# Patient Record
Sex: Male | Born: 1950
Health system: Southern US, Community
[De-identification: ages and names within clinical notes are randomized; demographics above are authoritative.]

## PROBLEM LIST (undated history)

## (undated) DIAGNOSIS — T7840XA Allergy, unspecified, initial encounter: Secondary | ICD-10-CM

## (undated) DIAGNOSIS — E785 Hyperlipidemia, unspecified: Secondary | ICD-10-CM

## (undated) DIAGNOSIS — H269 Unspecified cataract: Secondary | ICD-10-CM

## (undated) DIAGNOSIS — G4730 Sleep apnea, unspecified: Secondary | ICD-10-CM

## (undated) DIAGNOSIS — J45909 Unspecified asthma, uncomplicated: Secondary | ICD-10-CM

## (undated) DIAGNOSIS — K219 Gastro-esophageal reflux disease without esophagitis: Secondary | ICD-10-CM

## (undated) DIAGNOSIS — L0390 Cellulitis, unspecified: Secondary | ICD-10-CM

## (undated) DIAGNOSIS — A069 Amebiasis, unspecified: Secondary | ICD-10-CM

## (undated) HISTORY — DX: Sleep apnea, unspecified: G47.30

## (undated) HISTORY — DX: Amebiasis, unspecified: A06.9

## (undated) HISTORY — PX: TONSILLECTOMY: SUR1361

## (undated) HISTORY — DX: Hyperlipidemia, unspecified: E78.5

## (undated) HISTORY — DX: Allergy, unspecified, initial encounter: T78.40XA

## (undated) HISTORY — DX: Unspecified asthma, uncomplicated: J45.909

## (undated) HISTORY — DX: Unspecified cataract: H26.9

## (undated) HISTORY — DX: Gastro-esophageal reflux disease without esophagitis: K21.9

## (undated) HISTORY — PX: OTHER SURGICAL HISTORY: SHX169

## (undated) HISTORY — DX: Cellulitis, unspecified: L03.90

---

## 2007-08-22 LAB — PSA

## 2014-03-21 ENCOUNTER — Ambulatory Visit: Payer: Self-pay

## 2014-03-23 ENCOUNTER — Ambulatory Visit: Payer: Self-pay

## 2014-03-26 ENCOUNTER — Ambulatory Visit: Payer: Self-pay

## 2015-01-29 ENCOUNTER — Ambulatory Visit: Payer: Self-pay | Admitting: Unknown Physician Specialty

## 2015-03-10 ENCOUNTER — Ambulatory Visit (INDEPENDENT_AMBULATORY_CARE_PROVIDER_SITE_OTHER): Payer: No Typology Code available for payment source | Admitting: Unknown Physician Specialty

## 2015-03-10 ENCOUNTER — Encounter: Payer: Self-pay | Admitting: Unknown Physician Specialty

## 2015-03-10 ENCOUNTER — Other Ambulatory Visit: Payer: Self-pay | Admitting: Unknown Physician Specialty

## 2015-03-10 VITALS — BP 131/80 | HR 72 | Temp 97.4°F | Ht 67.5 in | Wt 362.0 lb

## 2015-03-10 DIAGNOSIS — Z0000 Encounter for general adult medical examination without abnormal findings: Secondary | ICD-10-CM

## 2015-03-10 DIAGNOSIS — G4730 Sleep apnea, unspecified: Secondary | ICD-10-CM | POA: Diagnosis not present

## 2015-03-10 DIAGNOSIS — E668 Other obesity: Secondary | ICD-10-CM | POA: Insufficient documentation

## 2015-03-10 DIAGNOSIS — Z23 Encounter for immunization: Secondary | ICD-10-CM

## 2015-03-10 DIAGNOSIS — K219 Gastro-esophageal reflux disease without esophagitis: Secondary | ICD-10-CM

## 2015-03-10 LAB — BAYER DCA HB A1C WAIVED: HB A1C (BAYER DCA - WAIVED): 5.8 % (ref <7.0)

## 2015-03-10 NOTE — Progress Notes (Signed)
BP 131/80 mmHg  Pulse 72  Temp(Src) 97.4 F (36.3 C)  Ht 5' 7.5" (1.715 m)  Wt 362 lb (164.202 kg)  BMI 55.83 kg/m2  SpO2 93%   Subjective:    Patient ID: Robert Rasmussen, male    DOB: 09-05-1950, 64 y.o.   MRN: [id number]  HPI: Robert Rasmussen is a 64 y.o. male  Chief Complaint  Patient presents with  . Establish Care    pt states he is here to re-establish care, states he wants to talk about diabetes and a colonoscopy  . Insomnia    pt states he has trouble sleeping for about a year now  . Mass    pt states he has a lump on left leg that he would like looked at, states it has been there for a few months   Diabetes "I don't exercise, eat right, work too hard, and am overweight"  Insomnia Stress related with death of parents.  Snores loudly.  No bed partner. Sleepy after meals.    Relevant past medical, surgical, family and social history reviewed and updated as indicated. Interim medical history since our last visit reviewed. Allergies and medications reviewed and updated.  Review of Systems  Constitutional: Negative.   HENT: Negative.   Eyes: Negative.   Respiratory: Negative.   Cardiovascular: Negative.   Gastrointestinal: Negative.   Endocrine: Negative.   Genitourinary: Negative.   Skin: Negative.        Lump on leg for 2 months  Allergic/Immunologic: Negative.   Neurological: Negative.   Hematological: Negative.   Psychiatric/Behavioral: Negative.     Per HPI unless specifically indicated above     Objective:    BP 131/80 mmHg  Pulse 72  Temp(Src) 97.4 F (36.3 C)  Ht 5' 7.5" (1.715 m)  Wt 362 lb (164.202 kg)  BMI 55.83 kg/m2  SpO2 93%  --------------------------------------------------------------------------------------------------------- Wt Readings from Last 3 Encounters:  03/10/15 362 lb (164.202 kg)  02/09/12 345 lb (156.491 kg)    Physical Exam  Constitutional: He is oriented to person, place, and time. He appears well-developed and  well-nourished.  HENT:  Head: Normocephalic.  Eyes: Pupils are equal, round, and reactive to light.  Cardiovascular: Normal rate, regular rhythm and normal heart sounds.   Pulmonary/Chest: Effort normal.  Abdominal: Soft. Bowel sounds are normal.  Musculoskeletal: Normal range of motion.  Neurological: He is alert and oriented to person, place, and time. He has normal reflexes.  Skin: Skin is warm and dry.  Psychiatric: He has a normal mood and affect. His behavior is normal. Judgment and thought content normal.      Assessment & Plan:   Problem List Items Addressed This Visit      Unprioritized   Extreme obesity (HCC)    Pt knows he needs to exercise more.  He does admit to food as comfort.        Relevant Orders   Bayer DCA Hb A1c Waived   Sleep apnea    Order sleep study      Relevant Orders   Ambulatory referral to Sleep Studies    Other Visit Diagnoses    Immunization due    -  Primary    Relevant Orders    Flu Vaccine QUAD 36+ mos PF IM (Fluarix & Fluzone Quad PF) (Completed)    Routine general medical examination at a health care facility        Relevant Orders    CBC with Differential/Platelet  HIV antibody    Hepatitis C antibody    Comprehensive metabolic panel    Lipid Panel w/o Chol/HDL Ratio    TSH    Vit D  25 hydroxy (rtn osteoporosis monitoring)        Follow up plan: Return in about 1 year (around 03/09/2016).

## 2015-03-10 NOTE — Assessment & Plan Note (Signed)
Pt knows he needs to exercise more.  He does admit to food as comfort.

## 2015-03-10 NOTE — Assessment & Plan Note (Signed)
Reviewed.  LDL is 156.  High but not in statin benefit group

## 2015-03-10 NOTE — Assessment & Plan Note (Signed)
Order sleep study 

## 2015-03-11 ENCOUNTER — Encounter: Payer: Self-pay | Admitting: Unknown Physician Specialty

## 2015-03-11 LAB — COMPREHENSIVE METABOLIC PANEL
A/G RATIO: 1.8 (ref 1.1–2.5)
ALT: 27 IU/L (ref 0–44)
AST: 23 IU/L (ref 0–40)
Albumin: 4.1 g/dL (ref 3.6–4.8)
Alkaline Phosphatase: 49 IU/L (ref 39–117)
BILIRUBIN TOTAL: 0.6 mg/dL (ref 0.0–1.2)
BUN/Creatinine Ratio: 13 (ref 10–22)
BUN: 12 mg/dL (ref 8–27)
CHLORIDE: 99 mmol/L (ref 97–108)
CO2: 27 mmol/L (ref 18–29)
Calcium: 9 mg/dL (ref 8.6–10.2)
Creatinine, Ser: 0.94 mg/dL (ref 0.76–1.27)
GFR calc non Af Amer: 87 mL/min/{1.73_m2} (ref >59)
GFR, EST AFRICAN AMERICAN: 100 mL/min/{1.73_m2} (ref >59)
GLOBULIN, TOTAL: 2.3 g/dL (ref 1.5–4.5)
Glucose: 96 mg/dL (ref 65–99)
POTASSIUM: 4.6 mmol/L (ref 3.5–5.2)
SODIUM: 139 mmol/L (ref 134–144)
TOTAL PROTEIN: 6.4 g/dL (ref 6.0–8.5)

## 2015-03-11 LAB — CBC WITH DIFFERENTIAL/PLATELET
BASOS ABS: 0.1 10*3/uL (ref 0.0–0.2)
Basos: 1 %
EOS (ABSOLUTE): 0.2 10*3/uL (ref 0.0–0.4)
Eos: 3 %
Hematocrit: 43 % (ref 37.5–51.0)
Hemoglobin: 14.4 g/dL (ref 12.6–17.7)
Immature Grans (Abs): 0 10*3/uL (ref 0.0–0.1)
Immature Granulocytes: 1 %
LYMPHS ABS: 2.4 10*3/uL (ref 0.7–3.1)
Lymphs: 28 %
MCH: 31.7 pg (ref 26.6–33.0)
MCHC: 33.5 g/dL (ref 31.5–35.7)
MCV: 95 fL (ref 79–97)
MONOS ABS: 0.6 10*3/uL (ref 0.1–0.9)
Monocytes: 7 %
Neutrophils Absolute: 5.4 10*3/uL (ref 1.4–7.0)
Neutrophils: 60 %
Platelets: 223 10*3/uL (ref 150–379)
RBC: 4.54 x10E6/uL (ref 4.14–5.80)
RDW: 12.9 % (ref 12.3–15.4)
WBC: 8.7 10*3/uL (ref 3.4–10.8)

## 2015-03-11 LAB — VITAMIN D 25 HYDROXY (VIT D DEFICIENCY, FRACTURES): VIT D 25 HYDROXY: 19.7 ng/mL — AB (ref 30.0–100.0)

## 2015-03-11 LAB — LIPID PANEL W/O CHOL/HDL RATIO
Cholesterol, Total: 189 mg/dL (ref 100–199)
HDL: 47 mg/dL (ref >39)
LDL Calculated: 128 mg/dL — ABNORMAL HIGH (ref 0–99)
Triglycerides: 70 mg/dL (ref 0–149)
VLDL Cholesterol Cal: 14 mg/dL (ref 5–40)

## 2015-03-11 LAB — HIV ANTIBODY (ROUTINE TESTING W REFLEX): HIV SCREEN 4TH GENERATION: NONREACTIVE

## 2015-03-11 LAB — HEPATITIS C ANTIBODY

## 2015-03-11 LAB — TSH: TSH: 1.99 u[IU]/mL (ref 0.450–4.500)

## 2015-03-19 ENCOUNTER — Telehealth: Payer: Self-pay | Admitting: Gastroenterology

## 2015-03-19 ENCOUNTER — Other Ambulatory Visit: Payer: Self-pay

## 2015-03-19 NOTE — Telephone Encounter (Signed)
Gastroenterology Pre-Procedure Review  Request Date: 04-15-2015 Requesting Physician: Dr.   PATIENT REVIEW QUESTIONS: The patient responded to the following health history questions as indicated:    1. Are you having any GI issues? no 2. Do you have a personal history of Polyps? no 3. Do you have a family history of Colon Cancer or Polyps? no 4. Diabetes Mellitus? no 5. Joint replacements in the past 12 months?no 6. Major health problems in the past 3 months?no 7. Any artificial heart valves, MVP, or defibrillator?no    MEDICATIONS & ALLERGIES:    Patient reports the following regarding taking any anticoagulation/antiplatelet therapy:   Plavix, Coumadin, Eliquis, Xarelto, Lovenox, Pradaxa, Brilinta, or Effient? no Aspirin? no  Patient confirms/reports the following medications:  Current Outpatient Prescriptions  Medication Sig Dispense Refill   Multiple Vitamins-Minerals (ONE-A-DAY MENS 50+ ADVANTAGE) TABS Take 1 tablet by mouth daily.     No current facility-administered medications for this visit.    Patient confirms/reports the following allergies:  No Known Allergies  No orders of the defined types were placed in this encounter.    AUTHORIZATION INFORMATION Primary Insurance: 1D#: Group #:  Secondary Insurance: 1D#: Group #:  SCHEDULE INFORMATION: Date: 04-15-2015 Time: Location:[facility]

## 2015-03-26 ENCOUNTER — Encounter: Payer: Self-pay | Admitting: Unknown Physician Specialty

## 2015-06-16 ENCOUNTER — Telehealth: Payer: Self-pay | Admitting: Gastroenterology

## 2015-06-16 NOTE — Telephone Encounter (Signed)
LVM for pt to return my call.

## 2015-06-16 NOTE — Telephone Encounter (Signed)
Patient left a voice message that [place] He needs to reschedule his colonoscopy. Please call to reschedule.

## 2015-06-17 ENCOUNTER — Ambulatory Visit
Admission: RE | Admit: 2015-06-17 | Payer: No Typology Code available for payment source | Source: Ambulatory Visit | Admitting: Gastroenterology

## 2015-06-17 ENCOUNTER — Encounter: Admission: RE | Payer: Self-pay | Source: Ambulatory Visit

## 2015-06-17 SURGERY — COLONOSCOPY WITH PROPOFOL
Anesthesia: General

## 2015-07-01 NOTE — Telephone Encounter (Signed)
LVM for pt to return my call if he wants to reschedule colonoscopy.

## 2016-03-10 ENCOUNTER — Encounter: Payer: No Typology Code available for payment source | Admitting: Unknown Physician Specialty

## 2016-05-28 ENCOUNTER — Encounter: Payer: No Typology Code available for payment source | Admitting: Unknown Physician Specialty

## 2016-06-23 ENCOUNTER — Encounter: Payer: No Typology Code available for payment source | Admitting: Unknown Physician Specialty

## 2016-06-24 ENCOUNTER — Encounter: Payer: No Typology Code available for payment source | Admitting: Unknown Physician Specialty

## 2016-07-14 ENCOUNTER — Encounter: Payer: Self-pay | Admitting: Unknown Physician Specialty

## 2016-07-14 ENCOUNTER — Ambulatory Visit (INDEPENDENT_AMBULATORY_CARE_PROVIDER_SITE_OTHER): Payer: No Typology Code available for payment source | Admitting: Unknown Physician Specialty

## 2016-07-14 VITALS — BP 120/80 | HR 73 | Temp 98.5°F | Ht 67.5 in | Wt 353.4 lb

## 2016-07-14 DIAGNOSIS — E668 Other obesity: Secondary | ICD-10-CM

## 2016-07-14 DIAGNOSIS — Z0000 Encounter for general adult medical examination without abnormal findings: Secondary | ICD-10-CM

## 2016-07-14 DIAGNOSIS — G4730 Sleep apnea, unspecified: Secondary | ICD-10-CM

## 2016-07-14 DIAGNOSIS — Z23 Encounter for immunization: Secondary | ICD-10-CM | POA: Diagnosis not present

## 2016-07-14 NOTE — Patient Instructions (Addendum)
Influenza (Flu) Vaccine (Inactivated or Recombinant): What You Need to Know 1. Why get vaccinated? Influenza ("flu") is a contagious disease that spreads around the [place] every year, usually between October and May. Flu is caused by influenza viruses, and is spread mainly by coughing, sneezing, and close contact. Anyone can get flu. Flu strikes suddenly and can last several days. Symptoms vary by age, but can include:  fever/chills  sore throat  muscle aches  fatigue  cough  headache  runny or stuffy nose  Flu can also lead to pneumonia and blood infections, and cause diarrhea and seizures in children. If you have a medical condition, such as heart or lung disease, flu can make it worse. Flu is more dangerous for some people. Infants and young children, people 64 years of age and older, pregnant women, and people with certain health conditions or a weakened immune system are at greatest risk. Each year thousands of people in the [place] die from flu, and many more are hospitalized. Flu vaccine can:  keep you from getting flu,  make flu less severe if you do get it, and  keep you from spreading flu to your family and other people. 2. Inactivated and recombinant flu vaccines A dose of flu vaccine is recommended every flu season. Children 6 months through 8 years of age may need two doses during the same flu season. Everyone else needs only one dose each flu season. Some inactivated flu vaccines contain a very small amount of a mercury-based preservative called thimerosal. Studies have not shown thimerosal in vaccines to be harmful, but flu vaccines that do not contain thimerosal are available. There is no live flu virus in flu shots. They cannot cause the flu. There are many flu viruses, and they are always changing. Each year a new flu vaccine is made to protect against three or four viruses that are likely to cause disease in the upcoming flu season. But even when the  vaccine doesn't exactly match these viruses, it may still provide some protection. Flu vaccine cannot prevent:  flu that is caused by a virus not covered by the vaccine, or  illnesses that look like flu but are not.  It takes about 2 weeks for protection to develop after vaccination, and protection lasts through the flu season. 3. Some people should not get this vaccine Tell the person who is giving you the vaccine:  If you have any severe, life-threatening allergies. If you ever had a life-threatening allergic reaction after a dose of flu vaccine, or have a severe allergy to any part of this vaccine, you may be advised not to get vaccinated. Most, but not all, types of flu vaccine contain a small amount of egg protein.  If you ever had Guillain-Barr Syndrome (also called GBS). Some people with a history of GBS should not get this vaccine. This should be discussed with your doctor.  If you are not feeling well. It is usually okay to get flu vaccine when you have a mild illness, but you might be asked to come back when you feel better.  4. Risks of a vaccine reaction With any medicine, including vaccines, there is a chance of reactions. These are usually mild and go away on their own, but serious reactions are also possible. Most people who get a flu shot do not have any problems with it. Minor problems following a flu shot include:  soreness, redness, or swelling where the shot was given  hoarseness  sore,   red or itchy eyes  cough  fever  aches  headache  itching  fatigue  If these problems occur, they usually begin soon after the shot and last 1 or 2 days. More serious problems following a flu shot can include the following:  There may be a small increased risk of Guillain-Barre Syndrome (GBS) after inactivated flu vaccine. This risk has been estimated at 1 or 2 additional cases per million people vaccinated. This is much lower than the risk of severe complications from  flu, which can be prevented by flu vaccine.  Young children who get the flu shot along with pneumococcal vaccine (PCV13) and/or DTaP vaccine at the same time might be slightly more likely to have a seizure caused by fever. Ask your doctor for more information. Tell your doctor if a child who is getting flu vaccine has ever had a seizure.  Problems that could happen after any injected vaccine:  People sometimes faint after a medical procedure, including vaccination. Sitting or lying down for about 15 minutes can help prevent fainting, and injuries caused by a fall. Tell your doctor if you feel dizzy, or have vision changes or ringing in the ears.  Some people get severe pain in the shoulder and have difficulty moving the arm where a shot was given. This happens very rarely.  Any medication can cause a severe allergic reaction. Such reactions from a vaccine are very rare, estimated at about 1 in a million doses, and would happen within a few minutes to a few hours after the vaccination. As with any medicine, there is a very remote chance of a vaccine causing a serious injury or death. The safety of vaccines is always being monitored. For more information, visit: www.cdc.gov/vaccinesafety/ 5. What if there is a serious reaction? What should I look for? Look for anything that concerns you, such as signs of a severe allergic reaction, very high fever, or unusual behavior. Signs of a severe allergic reaction can include hives, swelling of the face and throat, difficulty breathing, a fast heartbeat, dizziness, and weakness. These would start a few minutes to a few hours after the vaccination. What should I do?  If you think it is a severe allergic reaction or other emergency that can't wait, call 9-1-1 and get the person to the nearest hospital. Otherwise, call your doctor.  Reactions should be reported to the Vaccine Adverse Event Reporting System (VAERS). Your doctor should file this report, or you  can do it yourself through the VAERS web site at www.vaers.hhs.gov, or by calling 1-800-822-7967. ? VAERS does not give medical advice. 6. The National Vaccine Injury Compensation Program The National Vaccine Injury Compensation Program (VICP) is a federal program that was created to compensate people who may have been injured by certain vaccines. Persons who believe they may have been injured by a vaccine can learn about the program and about filing a claim by calling 1-800-338-2382 or visiting the VICP website at www.hrsa.gov/vaccinecompensation. There is a time limit to file a claim for compensation. 7. How can I learn more?  Ask your healthcare provider. He or she can give you the vaccine package insert or suggest other sources of information.  Call your local or state health department.  Contact the Centers for Disease Control and Prevention (CDC): ? Call 1-800-232-4636 (1-800-CDC-INFO) or ? Visit CDC's website at www.cdc.gov/flu Vaccine Information Statement, Inactivated Influenza Vaccine (01/11/2014) This information is not intended to replace advice given to you by your health care provider. Make sure   you discuss any questions you have with your health care provider. Document Released: 03/18/2006 Document Revised: 02/12/2016 Document Reviewed: 02/12/2016 Elsevier Interactive Patient Education  2017 [employer] Tdap Vaccine (Tetanus, Diphtheria and Pertussis): What You Need to Know 1. Why get vaccinated? Tetanus, diphtheria and pertussis are very serious diseases. Tdap vaccine can protect us from these diseases. And, Tdap vaccine given to pregnant women can protect newborn babies against pertussis. TETANUS (Lockjaw) is rare in the [place] today. It causes painful muscle tightening and stiffness, usually all over the body.  It can lead to tightening of muscles in the head and neck so you can't open your mouth, swallow, or sometimes even breathe. Tetanus kills about 1 out of 10  people who are infected even after receiving the best medical care.  DIPHTHERIA is also rare in the [place] today. It can cause a thick coating to form in the back of the throat.  It can lead to breathing problems, heart failure, paralysis, and death.  PERTUSSIS (Whooping Cough) causes severe coughing spells, which can cause difficulty breathing, vomiting and disturbed sleep.  It can also lead to weight loss, incontinence, and rib fractures. Up to 2 in 100 adolescents and 5 in 100 adults with pertussis are hospitalized or have complications, which could include pneumonia or death.  These diseases are caused by bacteria. Diphtheria and pertussis are spread from person to person through secretions from coughing or sneezing. Tetanus enters the body through cuts, scratches, or wounds. Before vaccines, as many as 200,000 cases of diphtheria, 200,000 cases of pertussis, and hundreds of cases of tetanus, were reported in the [place] each year. Since vaccination began, reports of cases for tetanus and diphtheria have dropped by about 99% and for pertussis by about 80%. 2. Tdap vaccine Tdap vaccine can protect adolescents and adults from tetanus, diphtheria, and pertussis. One dose of Tdap is routinely given at age 11 or 12. People who did not get Tdap at that age should get it as soon as possible. Tdap is especially important for healthcare professionals and anyone having close contact with a baby younger than 12 months. Pregnant women should get a dose of Tdap during every pregnancy, to protect the newborn from pertussis. Infants are most at risk for severe, life-threatening complications from pertussis. Another vaccine, called Td, protects against tetanus and diphtheria, but not pertussis. A Td booster should be given every 10 years. Tdap may be given as one of these boosters if you have never gotten Tdap before. Tdap may also be given after a severe cut or burn to prevent tetanus  infection. Your doctor or the person giving you the vaccine can give you more information. Tdap may safely be given at the same time as other vaccines. 3. Some people should not get this vaccine  A person who has ever had a life-threatening allergic reaction after a previous dose of any diphtheria, tetanus or pertussis containing vaccine, OR has a severe allergy to any part of this vaccine, should not get Tdap vaccine. Tell the person giving the vaccine about any severe allergies.  Anyone who had coma or long repeated seizures within 7 days after a childhood dose of DTP or DTaP, or a previous dose of Tdap, should not get Tdap, unless a cause other than the vaccine was found. They can still get Td.  Talk to your doctor if you: ? have seizures or another nervous system problem, ? had severe pain or swelling after any vaccine containing diphtheria,   tetanus or pertussis, ? ever had a condition called Guillain-Barr Syndrome (GBS), ? aren't feeling well on the day the shot is scheduled. 4. Risks With any medicine, including vaccines, there is a chance of side effects. These are usually mild and go away on their own. Serious reactions are also possible but are rare. Most people who get Tdap vaccine do not have any problems with it. Mild problems following Tdap: (Did not interfere with activities)  Pain where the shot was given (about 3 in 4 adolescents or 2 in 3 adults)  Redness or swelling where the shot was given (about 1 person in 5)  Mild fever of at least 100.4F (up to about 1 in 25 adolescents or 1 in 100 adults)  Headache (about 3 or 4 people in 10)  Tiredness (about 1 person in 3 or 4)  Nausea, vomiting, diarrhea, stomach ache (up to 1 in 4 adolescents or 1 in 10 adults)  Chills, sore joints (about 1 person in 10)  Body aches (about 1 person in 3 or 4)  Rash, swollen glands (uncommon)  Moderate problems following Tdap: (Interfered with activities, but did not require medical  attention)  Pain where the shot was given (up to 1 in 5 or 6)  Redness or swelling where the shot was given (up to about 1 in 16 adolescents or 1 in 12 adults)  Fever over 102F (about 1 in 100 adolescents or 1 in 250 adults)  Headache (about 1 in 7 adolescents or 1 in 10 adults)  Nausea, vomiting, diarrhea, stomach ache (up to 1 or 3 people in 100)  Swelling of the entire arm where the shot was given (up to about 1 in 500).  Severe problems following Tdap: (Unable to perform usual activities; required medical attention)  Swelling, severe pain, bleeding and redness in the arm where the shot was given (rare).  Problems that could happen after any vaccine:  People sometimes faint after a medical procedure, including vaccination. Sitting or lying down for about 15 minutes can help prevent fainting, and injuries caused by a fall. Tell your doctor if you feel dizzy, or have vision changes or ringing in the ears.  Some people get severe pain in the shoulder and have difficulty moving the arm where a shot was given. This happens very rarely.  Any medication can cause a severe allergic reaction. Such reactions from a vaccine are very rare, estimated at fewer than 1 in a million doses, and would happen within a few minutes to a few hours after the vaccination. As with any medicine, there is a very remote chance of a vaccine causing a serious injury or death. The safety of vaccines is always being monitored. For more information, visit: www.cdc.gov/vaccinesafety/ 5. What if there is a serious problem? What should I look for? Look for anything that concerns you, such as signs of a severe allergic reaction, very high fever, or unusual behavior. Signs of a severe allergic reaction can include hives, swelling of the face and throat, difficulty breathing, a fast heartbeat, dizziness, and weakness. These would usually start a few minutes to a few hours after the vaccination. What should I do?  If  you think it is a severe allergic reaction or other emergency that can't wait, call 9-1-1 or get the person to the nearest hospital. Otherwise, call your doctor.  Afterward, the reaction should be reported to the Vaccine Adverse Event Reporting System (VAERS). Your doctor might file this report, or you can do   it yourself through the VAERS web site at www.vaers.hhs.gov, or by calling 1-800-822-7967. ? VAERS does not give medical advice. 6. The National Vaccine Injury Compensation Program The National Vaccine Injury Compensation Program (VICP) is a federal program that was created to compensate people who may have been injured by certain vaccines. Persons who believe they may have been injured by a vaccine can learn about the program and about filing a claim by calling 1-800-338-2382 or visiting the VICP website at www.hrsa.gov/vaccinecompensation. There is a time limit to file a claim for compensation. 7. How can I learn more?  Ask your doctor. He or she can give you the vaccine package insert or suggest other sources of information.  Call your local or state health department.  Contact the Centers for Disease Control and Prevention (CDC): ? Call 1-800-232-4636 (1-800-CDC-INFO) or ? Visit CDC's website at www.cdc.gov/vaccines CDC Tdap Vaccine VIS (07/31/13) This information is not intended to replace advice given to you by your health care provider. Make sure you discuss any questions you have with your health care provider. Document Released: 11/23/2011 Document Revised: 02/12/2016 Document Reviewed: 02/12/2016 Elsevier Interactive Patient Education  2017 [employer] Inc[employer]  

## 2016-07-14 NOTE — Assessment & Plan Note (Addendum)
Discussed previous sleep study results with patient and encouraged the purchase and use of a CPAP machine.

## 2016-07-14 NOTE — Assessment & Plan Note (Signed)
Will continue working on diet and exercise.

## 2016-07-14 NOTE — Progress Notes (Signed)
BP 120/80 (BP Location: Left Arm, Cuff Size: Large)   Pulse 73   Temp 98.5 F (36.9 C)   Ht 5' 7.5" (1.715 m)   Wt (!) 353 lb 6.4 oz (160.3 kg)   SpO2 94%   BMI 54.53 kg/m    Subjective:    Patient ID: Robert Rasmussen, male    DOB: 04/08/1951, 66 y.o.   MRN: [id number]  HPI: Robert Rasmussen is a 66 y.o. male  Chief Complaint  Patient presents with  . Annual Exam   Sleep Apnea Patient states he was having difficulty sleeping around the time of his last physical and was referred for a sleep study. Reports that he did this study and was told at the sleep center that he had sleep apnea, but never followed up about this. Notes he started sleeping better and did not feel he needed to purchase a CPAP machine. Enquired about whether CPAP machine is necessary.   Gastroesaphogeal Reflux Patient states he has had no issues with his reflux recently.  Obesity Patient states he has been working on his diet and exercise. Recently purchased a fitbit and started cutting out some of the worse foods in his diet.  Relevant past medical, surgical, family and social history reviewed and updated as indicated. Interim medical history since our last visit reviewed. Allergies and medications reviewed and updated.  Social History   Social History  . Marital status: Divorced    Spouse name: N/A  . Number of children: N/A  . Years of education: N/A   Occupational History  . Not on file.   Social History Main Topics  . Smoking status: Never Smoker  . Smokeless tobacco: Never Used  . Alcohol use 0.0 oz/week     Comment: pt states he has a drink about every 4 weeks  . Drug use: No  . Sexual activity: Yes   Other Topics Concern  . Not on file   Social History Narrative  . No narrative on file   Family History  Problem Relation Age of Onset  . Cancer Mother     breast  . Cancer Father     skin  . Cancer Sister     breast  . Heart disease Paternal Grandmother     heart attack   Past  Medical History:  Diagnosis Date  . Allergy    seasonal  . Amebic dysentery   . Asthma   . Cellulitis   . GERD (gastroesophageal reflux disease)    Past Surgical History:  Procedure Laterality Date  . amebic dysentery    . TONSILLECTOMY     No Known Allergies  Review of Systems  Constitutional: Negative.   HENT: Negative.   Eyes: Negative.   Respiratory: Negative.   Cardiovascular: Negative.   Gastrointestinal: Negative.   Endocrine: Negative.   Genitourinary: Negative.   Musculoskeletal: Negative.   Skin:       Notes some skin tags and frequent skin changes on his nose.  Allergic/Immunologic: Negative.   Neurological: Negative.   Hematological: Negative.   Psychiatric/Behavioral: Negative.     Per HPI unless specifically indicated above     Objective:    BP 120/80 (BP Location: Left Arm, Cuff Size: Large)   Pulse 73   Temp 98.5 F (36.9 C)   Ht 5' 7.5" (1.715 m)   Wt (!) 353 lb 6.4 oz (160.3 kg)   SpO2 94%   BMI 54.53 kg/m   Wt Readings from Last 3  Encounters:  07/14/16 (!) 353 lb 6.4 oz (160.3 kg)  03/10/15 (!) 362 lb (164.2 kg)  02/09/12 (!) 345 lb (156.5 kg)    Physical Exam  Constitutional: He is oriented to person, place, and time. He appears well-developed and well-nourished. No distress.  HENT:  Head: Normocephalic and atraumatic.  Right Ear: External ear normal.  Left Ear: External ear normal.  Nose: Nose normal.  Mouth/Throat: Oropharynx is clear and moist.  Eyes: Conjunctivae and EOM are normal. Pupils are equal, round, and reactive to light. Right eye exhibits no discharge. Left eye exhibits no discharge. No scleral icterus.  Neck: Normal range of motion. Neck supple. No thyromegaly present.  Cardiovascular: Normal rate, regular rhythm and normal heart sounds.   Pulmonary/Chest: Effort normal and breath sounds normal. No respiratory distress.  Abdominal: Soft. Bowel sounds are normal. He exhibits no distension and no mass. There is no  tenderness. There is no rebound and no guarding.  Musculoskeletal: Normal range of motion. He exhibits no edema or deformity.  Neurological: He is alert and oriented to person, place, and time.  Skin: Skin is warm and dry. He is not diaphoretic.  Psychiatric: He has a normal mood and affect. His behavior is normal. Thought content normal.  Nursing note and vitals reviewed.   Results for orders placed or performed in visit on 03/10/15  CBC with Differential/Platelet  Result Value Ref Range   WBC 8.7 3.4 - 10.8 x10E3/uL   RBC 4.54 4.14 - 5.80 x10E6/uL   Hemoglobin 14.4 12.6 - 17.7 g/dL   Hematocrit 16.143.0 09.637.5 - 51.0 %   MCV 95 79 - 97 fL   MCH 31.7 26.6 - 33.0 pg   MCHC 33.5 31.5 - 35.7 g/dL   RDW 04.512.9 40.912.3 - 81.115.4 %   Platelets 223 150 - 379 x10E3/uL   Neutrophils 60 %   Lymphs 28 %   Monocytes 7 %   Eos 3 %   Basos 1 %   Neutrophils Absolute 5.4 1.4 - 7.0 x10E3/uL   Lymphocytes Absolute 2.4 0.7 - 3.1 x10E3/uL   Monocytes Absolute 0.6 0.1 - 0.9 x10E3/uL   EOS (ABSOLUTE) 0.2 0.0 - 0.4 x10E3/uL   Basophils Absolute 0.1 0.0 - 0.2 x10E3/uL   Immature Granulocytes 1 %   Immature Grans (Abs) 0.0 0.0 - 0.1 x10E3/uL  HIV antibody  Result Value Ref Range   HIV Screen 4th Generation wRfx Non Reactive Non Reactive  Hepatitis C antibody  Result Value Ref Range   Hep C Virus Ab <0.1 0.0 - 0.9 s/co ratio  Comprehensive metabolic panel  Result Value Ref Range   Glucose 96 65 - 99 mg/dL   BUN 12 8 - 27 mg/dL   Creatinine, Ser 9.140.94 0.76 - 1.27 mg/dL   GFR calc non Af Amer 87 >59 mL/min/1.73   GFR calc Af Amer 100 >59 mL/min/1.73   BUN/Creatinine Ratio 13 10 - 22   Sodium 139 134 - 144 mmol/L   Potassium 4.6 3.5 - 5.2 mmol/L   Chloride 99 97 - 108 mmol/L   CO2 27 18 - 29 mmol/L   Calcium 9.0 8.6 - 10.2 mg/dL   Total Protein 6.4 6.0 - 8.5 g/dL   Albumin 4.1 3.6 - 4.8 g/dL   Globulin, Total 2.3 1.5 - 4.5 g/dL   Albumin/Globulin Ratio 1.8 1.1 - 2.5   Bilirubin Total 0.6 0.0 - 1.2 mg/dL    Alkaline Phosphatase 49 39 - 117 IU/L   AST 23 0 - 40  IU/L   ALT 27 0 - 44 IU/L  Lipid Panel w/o Chol/HDL Ratio  Result Value Ref Range   Cholesterol, Total 189 100 - 199 mg/dL   Triglycerides 70 0 - 149 mg/dL   HDL 47 >16 mg/dL   VLDL Cholesterol Cal 14 5 - 40 mg/dL   LDL Calculated 109 (H) 0 - 99 mg/dL  Bayer DCA Hb U0A Waived  Result Value Ref Range   Bayer DCA Hb A1c Waived 5.8 <7.0 %  TSH  Result Value Ref Range   TSH 1.990 0.450 - 4.500 uIU/mL  Vit D  25 hydroxy (rtn osteoporosis monitoring)  Result Value Ref Range   Vit D, 25-Hydroxy 19.7 (L) 30.0 - 100.0 ng/mL      Assessment & Plan:   Problem List Items Addressed This Visit      Respiratory   Sleep apnea    Discussed previous sleep study results with patient and encouraged the purchase and use of a CPAP machine.         Other   Extreme obesity (HCC)    Will continue working on diet and exercise.       Other Visit Diagnoses    Need for influenza vaccination    -  Primary   Relevant Orders   Flu Vaccine QUAD 36+ mos IM (Completed)   Need for diphtheria-tetanus-pertussis (Tdap) vaccine, adult/adolescent       Relevant Orders   Tdap vaccine greater than or equal to 7yo IM (Completed)   Routine general medical examination at a health care facility       Relevant Orders   CBC with Differential/Platelet   Comprehensive metabolic panel   Lipid Panel w/o Chol/HDL Ratio   TSH   Cologuard       Follow up plan: Return in about 1 year (around 07/14/2017) for physical..

## 2016-07-15 LAB — LIPID PANEL W/O CHOL/HDL RATIO
Cholesterol, Total: 205 mg/dL — ABNORMAL HIGH (ref 100–199)
HDL: 47 mg/dL (ref >39)
LDL Calculated: 130 mg/dL — ABNORMAL HIGH (ref 0–99)
Triglycerides: 140 mg/dL (ref 0–149)
VLDL CHOLESTEROL CAL: 28 mg/dL (ref 5–40)

## 2016-07-15 LAB — COMPREHENSIVE METABOLIC PANEL
A/G RATIO: 1.9 (ref 1.2–2.2)
ALBUMIN: 4.3 g/dL (ref 3.6–4.8)
ALT: 27 IU/L (ref 0–44)
AST: 23 IU/L (ref 0–40)
Alkaline Phosphatase: 48 IU/L (ref 39–117)
BUN / CREAT RATIO: 14 (ref 10–24)
BUN: 17 mg/dL (ref 8–27)
Bilirubin Total: 0.4 mg/dL (ref 0.0–1.2)
CALCIUM: 9.4 mg/dL (ref 8.6–10.2)
CO2: 24 mmol/L (ref 18–29)
Chloride: 102 mmol/L (ref 96–106)
Creatinine, Ser: 1.18 mg/dL (ref 0.76–1.27)
GFR, EST AFRICAN AMERICAN: 75 mL/min/{1.73_m2} (ref >59)
GFR, EST NON AFRICAN AMERICAN: 65 mL/min/{1.73_m2} (ref >59)
Globulin, Total: 2.3 g/dL (ref 1.5–4.5)
Glucose: 96 mg/dL (ref 65–99)
POTASSIUM: 4.6 mmol/L (ref 3.5–5.2)
Sodium: 144 mmol/L (ref 134–144)
TOTAL PROTEIN: 6.6 g/dL (ref 6.0–8.5)

## 2016-07-15 LAB — CBC WITH DIFFERENTIAL/PLATELET
BASOS: 1 %
Basophils Absolute: 0.1 10*3/uL (ref 0.0–0.2)
EOS (ABSOLUTE): 0.4 10*3/uL (ref 0.0–0.4)
EOS: 4 %
HEMATOCRIT: 43.2 % (ref 37.5–51.0)
HEMOGLOBIN: 14.6 g/dL (ref 13.0–17.7)
IMMATURE GRANS (ABS): 0.1 10*3/uL (ref 0.0–0.1)
IMMATURE GRANULOCYTES: 1 %
LYMPHS: 27 %
Lymphocytes Absolute: 2.7 10*3/uL (ref 0.7–3.1)
MCH: 31.7 pg (ref 26.6–33.0)
MCHC: 33.8 g/dL (ref 31.5–35.7)
MCV: 94 fL (ref 79–97)
MONOCYTES: 10 %
Monocytes Absolute: 1 10*3/uL — ABNORMAL HIGH (ref 0.1–0.9)
NEUTROS ABS: 5.7 10*3/uL (ref 1.4–7.0)
NEUTROS PCT: 57 %
Platelets: 250 10*3/uL (ref 150–379)
RBC: 4.6 x10E6/uL (ref 4.14–5.80)
RDW: 13.3 % (ref 12.3–15.4)
WBC: 9.8 10*3/uL (ref 3.4–10.8)

## 2016-07-15 LAB — TSH: TSH: 1.39 u[IU]/mL (ref 0.450–4.500)

## 2016-07-16 ENCOUNTER — Other Ambulatory Visit: Payer: Self-pay | Admitting: Unknown Physician Specialty

## 2016-07-16 ENCOUNTER — Encounter: Payer: Self-pay | Admitting: Unknown Physician Specialty

## 2016-07-16 DIAGNOSIS — G4733 Obstructive sleep apnea (adult) (pediatric): Secondary | ICD-10-CM

## 2016-07-19 ENCOUNTER — Other Ambulatory Visit: Payer: Self-pay | Admitting: Unknown Physician Specialty

## 2016-07-21 ENCOUNTER — Telehealth: Payer: Self-pay

## 2016-07-21 ENCOUNTER — Telehealth: Payer: Self-pay | Admitting: Unknown Physician Specialty

## 2016-07-21 NOTE — Telephone Encounter (Signed)
[facility] had a [facility]  Patient never received his CPAP supplies because he felt [facility] was too expensive.  I redirected [facility]. They denied to referral.   Patient would have to come back in and have another sleep study done at another facility. They'd have to order off their study for insurance to cover it but insurance probably won't cover another sleep study by another facility because it was just done 2 years ago by [facility]. Patient would be spending more money going to another facility then ordering his supplies through [facility].  Patient just needs to call [facility] and say that he needs his CPAP supplies from his study ordered. Nothing else has to be done.  Tried calling patient. No answer. Left message for him to return my call.

## 2016-07-21 NOTE — Telephone Encounter (Signed)
Linda from [facility]--she received a referral for sleep study and supplies for this patient from [place] but according to [place] the patient only needs supplies not a study and she states that any physician would be able to order the supplies.  If any questions she said you could reach her to discuss.  Bonita QuinLinda [phone]  Thanks

## 2016-07-21 NOTE — Telephone Encounter (Signed)
See other telephone note from 07/21/2016.

## 2016-07-21 NOTE — Telephone Encounter (Signed)
Patient notified

## 2016-07-21 NOTE — Telephone Encounter (Signed)
Thanks.  Just let him know.  Sometimes the home respiratory companies will cover this

## 2017-01-31 ENCOUNTER — Encounter: Payer: Self-pay | Admitting: Unknown Physician Specialty

## 2017-02-05 LAB — COLOGUARD: COLOGUARD: NEGATIVE

## 2017-06-08 ENCOUNTER — Ambulatory Visit: Payer: BLUE CROSS/BLUE SHIELD | Admitting: Unknown Physician Specialty

## 2017-06-08 ENCOUNTER — Encounter: Payer: Self-pay | Admitting: Unknown Physician Specialty

## 2017-06-08 VITALS — BP 117/80 | HR 56 | Temp 98.9°F | Wt 356.8 lb

## 2017-06-08 DIAGNOSIS — G4730 Sleep apnea, unspecified: Secondary | ICD-10-CM

## 2017-06-08 DIAGNOSIS — R059 Cough, unspecified: Secondary | ICD-10-CM

## 2017-06-08 DIAGNOSIS — I10 Essential (primary) hypertension: Secondary | ICD-10-CM

## 2017-06-08 DIAGNOSIS — R05 Cough: Secondary | ICD-10-CM | POA: Diagnosis not present

## 2017-06-08 DIAGNOSIS — K219 Gastro-esophageal reflux disease without esophagitis: Secondary | ICD-10-CM | POA: Diagnosis not present

## 2017-06-08 NOTE — Progress Notes (Signed)
BP 117/80 (BP Location: Left Arm, Cuff Size: Large)   Pulse (!) 56   Temp 98.9 F (37.2 C) (Oral)   Wt (!) 356 lb 12.8 oz (161.8 kg)   SpO2 96%   BMI 55.06 kg/m    Subjective:    Patient ID: Robert Rasmussen, male    DOB: 11/02/1950, 67 y.o.   MRN: [id number]  HPI: Robert Rasmussen is a 67 y.o. male  Chief Complaint  Patient presents with  . Cough    pt states he has had a cough and chest congestion for about a month    Cough  This is a new problem. Episode onset: 1 month. The problem has been gradually improving. The problem occurs constantly. The cough is non-productive. Associated symptoms include wheezing. Pertinent negatives include no chest pain, chills, ear congestion, ear pain, fever, headaches, heartburn, hemoptysis, myalgias, nasal congestion, postnasal drip, rash, rhinorrhea, sore throat, shortness of breath, sweats or weight loss. Exacerbated by: morning it is worse. He has tried OTC cough suppressant for the symptoms. The treatment provided mild relief. His past medical history is significant for asthma. There is no history of COPD or pneumonia. asthma as a [place]    Relevant past medical, surgical, family and social history reviewed and updated as indicated. Interim medical history since our last visit reviewed. Allergies and medications reviewed and updated.  Review of Systems  Constitutional: Negative for chills, fever and weight loss.  HENT: Negative for ear pain, postnasal drip, rhinorrhea and sore throat.   Respiratory: Positive for cough and wheezing. Negative for hemoptysis and shortness of breath.   Cardiovascular: Negative for chest pain.  Gastrointestinal: Negative for heartburn.  Musculoskeletal: Negative for myalgias.  Skin: Negative for rash.  Neurological: Negative for headaches.    Per HPI unless specifically indicated above     Objective:    BP 117/80 (BP Location: Left Arm, Cuff Size: Large)   Pulse (!) 56   Temp 98.9 F (37.2 C) (Oral)   Wt  (!) 356 lb 12.8 oz (161.8 kg)   SpO2 96%   BMI 55.06 kg/m   Wt Readings from Last 3 Encounters:  06/08/17 (!) 356 lb 12.8 oz (161.8 kg)  07/14/16 (!) 353 lb 6.4 oz (160.3 kg)  03/10/15 (!) 362 lb (164.2 kg)    Physical Exam  Constitutional: He is oriented to person, place, and time. He appears well-developed and well-nourished. No distress.  HENT:  Head: Normocephalic and atraumatic.  Eyes: Conjunctivae and lids are normal. Right eye exhibits no discharge. Left eye exhibits no discharge. No scleral icterus.  Neck: Normal range of motion. Neck supple. No JVD present. Carotid bruit is not present. No thyromegaly present.  Cardiovascular: Normal rate, regular rhythm and normal heart sounds.  Pulmonary/Chest: Effort normal and breath sounds normal. No respiratory distress.  Abdominal: Normal appearance. There is no splenomegaly or hepatomegaly.  Musculoskeletal: Normal range of motion.  Neurological: He is alert and oriented to person, place, and time.  Skin: Skin is warm, dry and intact. No rash noted. No pallor.  Psychiatric: He has a normal mood and affect. His behavior is normal. Judgment and thought content normal.    Results for orders placed or performed in visit on 01/31/17  Cologuard  Result Value Ref Range   Cologuard Negative       Assessment & Plan:   Problem List Items Addressed This Visit      Unprioritized   GERD (gastroesophageal reflux disease)    Start  Nexium as that is what he took in the past      Sleep apnea    Working on getting this treated       Other Visit Diagnoses    Cough    -  Primary   New problem.  ? related to reflux.  Start PPI.  Get chest x-ray   Relevant Orders   DG Chest 2 View       Follow up plan: Return in about 4 weeks (around 07/06/2017) for physical.

## 2017-06-08 NOTE — Patient Instructions (Addendum)
DASH Eating Plan DASH stands for "Dietary Approaches to Stop Hypertension." The DASH eating plan is a healthy eating plan that has been shown to reduce high blood pressure (hypertension). It may also reduce your risk for type 2 diabetes, heart disease, and stroke. The DASH eating plan may also help with weight loss. What are tips for following this plan? General guidelines  Avoid eating more than 2,300 mg (milligrams) of salt (sodium) a day. If you have hypertension, you may need to reduce your sodium intake to 1,500 mg a day.  Limit alcohol intake to no more than 1 drink a day for nonpregnant women and 2 drinks a day for men. One drink equals 12 oz of beer, 5 oz of wine, or 1 oz of hard liquor.  Work with your health care provider to maintain a healthy body weight or to lose weight. Ask what an ideal weight is for you.  Get at least 30 minutes of exercise that causes your heart to beat faster (aerobic exercise) most days of the week. Activities may include walking, swimming, or biking.  Work with your health care provider or diet and nutrition specialist (dietitian) to adjust your eating plan to your individual calorie needs. Reading food labels  Check food labels for the amount of sodium per serving. Choose foods with less than 5 percent of the Daily Value of sodium. Generally, foods with less than 300 mg of sodium per serving fit into this eating plan.  To find whole grains, look for the word "whole" as the first word in the ingredient list. Shopping  Buy products labeled as "low-sodium" or "no salt added."  Buy fresh foods. Avoid canned foods and premade or frozen meals. Cooking  Avoid adding salt when cooking. Use salt-free seasonings or herbs instead of table salt or sea salt. Check with your health care provider or pharmacist before using salt substitutes.  Do not fry foods. Cook foods using healthy methods such as baking, boiling, grilling, and broiling instead.  Cook with  heart-healthy oils, such as olive, canola, soybean, or sunflower oil. Meal planning   Eat a balanced diet that includes: ? 5 or more servings of fruits and vegetables each day. At each meal, try to fill half of your plate with fruits and vegetables. ? Up to 6-8 servings of whole grains each day. ? Less than 6 oz of lean meat, poultry, or fish each day. A 3-oz serving of meat is about the same size as a deck of cards. One egg equals 1 oz. ? 2 servings of low-fat dairy each day. ? A serving of nuts, seeds, or beans 5 times each week. ? Heart-healthy fats. Healthy fats called Omega-3 fatty acids are found in foods such as flaxseeds and coldwater fish, like sardines, salmon, and mackerel.  Limit how much you eat of the following: ? Canned or prepackaged foods. ? Food that is high in trans fat, such as fried foods. ? Food that is high in saturated fat, such as fatty meat. ? Sweets, desserts, sugary drinks, and other foods with added sugar. ? Full-fat dairy products.  Do not salt foods before eating.  Try to eat at least 2 vegetarian meals each week.  Eat more home-cooked food and less restaurant, buffet, and fast food.  When eating at a restaurant, ask that your food be prepared with less salt or no salt, if possible. What foods are recommended? The items listed may not be a complete list. Talk with your dietitian about what   dietary choices are best for you. Grains Whole-grain or whole-wheat bread. Whole-grain or whole-wheat pasta. Brown rice. Oatmeal. Quinoa. Bulgur. Whole-grain and low-sodium cereals. Pita bread. Low-fat, low-sodium crackers. Whole-wheat flour tortillas. Vegetables Fresh or frozen vegetables (raw, steamed, roasted, or grilled). Low-sodium or reduced-sodium tomato and vegetable juice. Low-sodium or reduced-sodium tomato sauce and tomato paste. Low-sodium or reduced-sodium canned vegetables. Fruits All fresh, dried, or frozen fruit. Canned fruit in natural juice (without  added sugar). Meat and other protein foods Skinless chicken or turkey. Ground chicken or turkey. Pork with fat trimmed off. Fish and seafood. Egg whites. Dried beans, peas, or lentils. Unsalted nuts, nut butters, and seeds. Unsalted canned beans. Lean cuts of beef with fat trimmed off. Low-sodium, lean deli meat. Dairy Low-fat (1%) or fat-free (skim) milk. Fat-free, low-fat, or reduced-fat cheeses. Nonfat, low-sodium ricotta or cottage cheese. Low-fat or nonfat yogurt. Low-fat, low-sodium cheese. Fats and oils Soft margarine without trans fats. Vegetable oil. Low-fat, reduced-fat, or light mayonnaise and salad dressings (reduced-sodium). Canola, safflower, olive, soybean, and sunflower oils. Avocado. Seasoning and other foods Herbs. Spices. Seasoning mixes without salt. Unsalted popcorn and pretzels. Fat-free sweets. What foods are not recommended? The items listed may not be a complete list. Talk with your dietitian about what dietary choices are best for you. Grains Baked goods made with fat, such as croissants, muffins, or some breads. Dry pasta or rice meal packs. Vegetables Creamed or fried vegetables. Vegetables in a cheese sauce. Regular canned vegetables (not low-sodium or reduced-sodium). Regular canned tomato sauce and paste (not low-sodium or reduced-sodium). Regular tomato and vegetable juice (not low-sodium or reduced-sodium). Pickles. Olives. Fruits Canned fruit in a light or heavy syrup. Fried fruit. Fruit in cream or butter sauce. Meat and other protein foods Fatty cuts of meat. Ribs. Fried meat. Bacon. Sausage. Bologna and other processed lunch meats. Salami. Fatback. Hotdogs. Bratwurst. Salted nuts and seeds. Canned beans with added salt. Canned or smoked fish. Whole eggs or egg yolks. Chicken or turkey with skin. Dairy Whole or 2% milk, cream, and half-and-half. Whole or full-fat cream cheese. Whole-fat or sweetened yogurt. Full-fat cheese. Nondairy creamers. Whipped toppings.  Processed cheese and cheese spreads. Fats and oils Butter. Stick margarine. Lard. Shortening. Ghee. Bacon fat. Tropical oils, such as coconut, palm kernel, or palm oil. Seasoning and other foods Salted popcorn and pretzels. Onion salt, garlic salt, seasoned salt, table salt, and sea salt. Worcestershire sauce. Tartar sauce. Barbecue sauce. Teriyaki sauce. Soy sauce, including reduced-sodium. Steak sauce. Canned and packaged gravies. Fish sauce. Oyster sauce. Cocktail sauce. Horseradish that you find on the shelf. Ketchup. Mustard. Meat flavorings and tenderizers. Bouillon cubes. Hot sauce and Tabasco sauce. Premade or packaged marinades. Premade or packaged taco seasonings. Relishes. Regular salad dressings. Where to find more information:  National Heart, Lung, and Blood Institute: www.nhlbi.nih.gov  American Heart Association: www.heart.org Summary  The DASH eating plan is a healthy eating plan that has been shown to reduce high blood pressure (hypertension). It may also reduce your risk for type 2 diabetes, heart disease, and stroke.  With the DASH eating plan, you should limit salt (sodium) intake to 2,300 mg a day. If you have hypertension, you may need to reduce your sodium intake to 1,500 mg a day.  When on the DASH eating plan, aim to eat more fresh fruits and vegetables, whole grains, lean proteins, low-fat dairy, and heart-healthy fats.  Work with your health care provider or diet and nutrition specialist (dietitian) to adjust your eating plan to your individual   calorie needs. This information is not intended to replace advice given to you by your health care provider. Make sure you discuss any questions you have with your health care provider. Document Released: 05/13/2011 Document Revised: 05/17/2016 Document Reviewed: 05/17/2016 Elsevier Interactive Patient Education  2018 Elsevier Inc.  

## 2017-06-08 NOTE — Assessment & Plan Note (Signed)
Start Nexium as that is what he took in the past

## 2017-06-08 NOTE — Assessment & Plan Note (Signed)
Working on getting this treated

## 2017-06-08 NOTE — Assessment & Plan Note (Signed)
High today.  First time.  Dash diet

## 2017-06-09 ENCOUNTER — Telehealth: Payer: Self-pay | Admitting: Unknown Physician Specialty

## 2017-06-09 NOTE — Telephone Encounter (Signed)
Copied from CRM [phone]. Topic: Quick Communication - See Telephone Encounter >> Jun 09, 2017  4:43 PM Rudi CocoLathan, Miosha Behe M, NT wrote: CRM for notification. See Telephone encounter for:   06/09/17. Pt. Calling to check on the status of a referral for chest x ray and calling to get rx. For acid reflux pt. [facility]. And nothing was called in. Pt. Can be Can reached at [phone]  [facility] [place] - [id number] [place] [address] Phone: [phone] Fax: [phone]

## 2017-06-10 MED ORDER — ESOMEPRAZOLE MAGNESIUM 40 MG PO CPDR: 40.0000 mg | DELAYED_RELEASE_CAPSULE | Freq: Every day | ORAL | 3 refills | Status: DC

## 2017-06-10 NOTE — Telephone Encounter (Signed)
X-ray order is in chart. Will let patient know this but will route message to provider for medication. Did not see anything sent to the patient's pharmacy.

## 2017-06-10 NOTE — Telephone Encounter (Signed)
Called and left patient a VM letting him know that his prescription has been sent in as requested. Also let him know that the x-ray order was in so he just needs to go to [facility] whenever he can to have that done. Asked for patient to call back with any questions or concerns.

## 2017-06-13 ENCOUNTER — Other Ambulatory Visit: Payer: Self-pay | Admitting: Unknown Physician Specialty

## 2017-06-13 ENCOUNTER — Ambulatory Visit
Admission: RE | Admit: 2017-06-13 | Discharge: 2017-06-13 | Disposition: A | Discharge status: Home / Self Care | Payer: BLUE CROSS/BLUE SHIELD | Source: Ambulatory Visit | Attending: Unknown Physician Specialty | Admitting: Unknown Physician Specialty

## 2017-06-13 DIAGNOSIS — R918 Other nonspecific abnormal finding of lung field: Secondary | ICD-10-CM | POA: Diagnosis not present

## 2017-06-13 DIAGNOSIS — R05 Cough: Secondary | ICD-10-CM | POA: Diagnosis present

## 2017-06-13 DIAGNOSIS — R059 Cough, unspecified: Secondary | ICD-10-CM

## 2017-06-13 MED ORDER — AZITHROMYCIN 250 MG PO TABS: ORAL_TABLET | ORAL | 0 refills | Status: DC

## 2017-06-13 NOTE — Progress Notes (Signed)
Pt notified through mychart.  Rx for Z pack

## 2017-07-19 ENCOUNTER — Encounter: Payer: Self-pay | Admitting: Unknown Physician Specialty

## 2017-07-19 ENCOUNTER — Ambulatory Visit (INDEPENDENT_AMBULATORY_CARE_PROVIDER_SITE_OTHER): Payer: BLUE CROSS/BLUE SHIELD | Admitting: Unknown Physician Specialty

## 2017-07-19 VITALS — BP 137/81 | HR 66 | Temp 98.7°F | Ht 66.8 in | Wt 354.0 lb

## 2017-07-19 DIAGNOSIS — Z23 Encounter for immunization: Secondary | ICD-10-CM | POA: Diagnosis not present

## 2017-07-19 DIAGNOSIS — E668 Other obesity: Secondary | ICD-10-CM

## 2017-07-19 DIAGNOSIS — E669 Obesity, unspecified: Secondary | ICD-10-CM

## 2017-07-19 DIAGNOSIS — K219 Gastro-esophageal reflux disease without esophagitis: Secondary | ICD-10-CM

## 2017-07-19 DIAGNOSIS — L989 Disorder of the skin and subcutaneous tissue, unspecified: Secondary | ICD-10-CM | POA: Diagnosis not present

## 2017-07-19 DIAGNOSIS — Z0000 Encounter for general adult medical examination without abnormal findings: Secondary | ICD-10-CM | POA: Diagnosis not present

## 2017-07-19 DIAGNOSIS — G4730 Sleep apnea, unspecified: Secondary | ICD-10-CM | POA: Diagnosis not present

## 2017-07-19 NOTE — Assessment & Plan Note (Signed)
Just got CPAP.  Working on getting used to the mask

## 2017-07-19 NOTE — Progress Notes (Signed)
BP 137/81   Pulse 66   Temp 98.7 F (37.1 C) (Oral)   Ht 5' 6.8" (1.697 m)   Wt (!) 354 lb (160.6 kg)   SpO2 93%   BMI 55.78 kg/m    Subjective:    Patient ID: Robert Rasmussen, male    DOB: 12/03/1950, 67 y.o.   MRN: [id number]  HPI: Robert MuffRussell E Rasmussen is a 67 y.o. male  Chief Complaint  Patient presents with  . Annual Exam   Sleep apnea Pt states he does have his sleep mask.  Started last night and did not sleep well.    GERD Taking Nexium and doing well  [facility] 07/19/2017 07/14/2016  Decreased Interest 0 0  Down, Depressed, Hopeless 0 0  PHQ - 2 Score 0 0  Altered sleeping 0 -  Tired, decreased energy 0 -  Change in appetite 0 -  Feeling bad or failure about yourself  0 -  Trouble concentrating 0 -  Moving slowly or fidgety/restless 0 -  Suicidal thoughts 0 -  PHQ-9 Score 0 -   Social History   Socioeconomic History  . Marital status: Divorced    Spouse name: Not on file  . Number of children: Not on file  . Years of education: Not on file  . Highest education level: Not on file  Social Needs  . Financial resource strain: Not on file  . Food insecurity - worry: Not on file  . Food insecurity - inability: Not on file  . Transportation needs - medical: Not on file  . Transportation needs - non-medical: Not on file  Occupational History  . Not on file  Tobacco Use  . Smoking status: Never Smoker  . Smokeless tobacco: Never Used  Substance and Sexual Activity  . Alcohol use: Yes    Alcohol/week: 0.0 oz    Comment: pt states he has a drink about every 4 weeks  . Drug use: No  . Sexual activity: Yes  Other Topics Concern  . Not on file  Social History Narrative  . Not on file   Family History  Problem Relation Age of Onset  . Cancer Mother        breast  . Cancer Father        skin  . Cancer Sister        breast  . Heart disease Paternal Grandmother        heart attack   Past Medical History:  Diagnosis Date  . Allergy    seasonal  . Amebic dysentery   . Asthma   . Cellulitis   . GERD (gastroesophageal reflux disease)    Past Surgical History:  Procedure Laterality Date  . amebic dysentery    . TONSILLECTOMY        Relevant past medical, surgical, family and social history reviewed and updated as indicated. Interim medical history since our last visit reviewed. Allergies and medications reviewed and updated.  Review of Systems  Constitutional: Negative.   HENT: Positive for hearing loss.   Eyes: Negative.   Respiratory: Negative.   Cardiovascular: Negative.   Gastrointestinal: Negative.   Endocrine: Negative.   Genitourinary: Negative.   Musculoskeletal: Negative.   Skin:       Has some skin lesions.  Would like to to see a dermatologist    Allergic/Immunologic: Negative.   Neurological: Negative.   Hematological: Negative.   Psychiatric/Behavioral: Negative.     Per HPI unless specifically indicated above  Objective:    BP 137/81   Pulse 66   Temp 98.7 F (37.1 C) (Oral)   Ht 5' 6.8" (1.697 m)   Wt (!) 354 lb (160.6 kg)   SpO2 93%   BMI 55.78 kg/m   Wt Readings from Last 3 Encounters:  07/19/17 (!) 354 lb (160.6 kg)  06/08/17 (!) 356 lb 12.8 oz (161.8 kg)  07/14/16 (!) 353 lb 6.4 oz (160.3 kg)    Physical Exam  Results for orders placed or performed in visit on 01/31/17  Cologuard  Result Value Ref Range   Cologuard Negative       Assessment & Plan:   Problem List Items Addressed This Visit      Unprioritized   Extreme obesity    Pt has diet plans.  Is walking more      GERD (gastroesophageal reflux disease)    Stable, continue present medications.        Sleep apnea    Just got CPAP.  Working on getting used to the mask       Other Visit Diagnoses    Need for influenza vaccination    -  Primary   Relevant Orders   Flu Vaccine QUAD 36+ mos IM (Completed)   Skin lesions       Relevant Orders   Ambulatory referral to Dermatology   Annual  physical exam       Relevant Orders   CBC with Differential/Platelet   Comprehensive metabolic panel   Lipid Panel w/o Chol/HDL Ratio   TSH   PSA      Did cologuard last year Td due 2028  Follow up plan: Return in about 1 year (around 07/19/2018).

## 2017-07-19 NOTE — Assessment & Plan Note (Signed)
Stable, continue present medications.   

## 2017-07-19 NOTE — Patient Instructions (Addendum)

## 2017-07-19 NOTE — Assessment & Plan Note (Signed)
Pt has diet plans.  Is walking more

## 2017-07-20 ENCOUNTER — Encounter: Payer: Self-pay | Admitting: Unknown Physician Specialty

## 2017-07-20 LAB — TSH: TSH: 1.65 u[IU]/mL (ref 0.450–4.500)

## 2017-07-20 LAB — COMPREHENSIVE METABOLIC PANEL
A/G RATIO: 2 (ref 1.2–2.2)
ALBUMIN: 4.3 g/dL (ref 3.6–4.8)
ALT: 25 IU/L (ref 0–44)
AST: 24 IU/L (ref 0–40)
Alkaline Phosphatase: 53 IU/L (ref 39–117)
BUN / CREAT RATIO: 16 (ref 10–24)
BUN: 16 mg/dL (ref 8–27)
Bilirubin Total: 0.6 mg/dL (ref 0.0–1.2)
CO2: 22 mmol/L (ref 20–29)
Calcium: 9.4 mg/dL (ref 8.6–10.2)
Chloride: 103 mmol/L (ref 96–106)
Creatinine, Ser: 1 mg/dL (ref 0.76–1.27)
GFR, EST AFRICAN AMERICAN: 92 mL/min/{1.73_m2} (ref >59)
GFR, EST NON AFRICAN AMERICAN: 79 mL/min/{1.73_m2} (ref >59)
GLOBULIN, TOTAL: 2.2 g/dL (ref 1.5–4.5)
Glucose: 95 mg/dL (ref 65–99)
POTASSIUM: 4.4 mmol/L (ref 3.5–5.2)
SODIUM: 141 mmol/L (ref 134–144)
TOTAL PROTEIN: 6.5 g/dL (ref 6.0–8.5)

## 2017-07-20 LAB — CBC WITH DIFFERENTIAL/PLATELET
BASOS: 1 %
Basophils Absolute: 0.1 10*3/uL (ref 0.0–0.2)
EOS (ABSOLUTE): 0.2 10*3/uL (ref 0.0–0.4)
EOS: 4 %
HEMATOCRIT: 42.8 % (ref 37.5–51.0)
HEMOGLOBIN: 15 g/dL (ref 13.0–17.7)
Immature Grans (Abs): 0 10*3/uL (ref 0.0–0.1)
Immature Granulocytes: 0 %
Lymphocytes Absolute: 2.3 10*3/uL (ref 0.7–3.1)
Lymphs: 37 %
MCH: 33.3 pg — ABNORMAL HIGH (ref 26.6–33.0)
MCHC: 35 g/dL (ref 31.5–35.7)
MCV: 95 fL (ref 79–97)
MONOS ABS: 0.5 10*3/uL (ref 0.1–0.9)
Monocytes: 8 %
NEUTROS PCT: 50 %
Neutrophils Absolute: 3.2 10*3/uL (ref 1.4–7.0)
Platelets: 205 10*3/uL (ref 150–379)
RBC: 4.5 x10E6/uL (ref 4.14–5.80)
RDW: 12.9 % (ref 12.3–15.4)
WBC: 6.2 10*3/uL (ref 3.4–10.8)

## 2017-07-20 LAB — LIPID PANEL W/O CHOL/HDL RATIO
Cholesterol, Total: 181 mg/dL (ref 100–199)
HDL: 42 mg/dL (ref >39)
LDL Calculated: 122 mg/dL — ABNORMAL HIGH (ref 0–99)
Triglycerides: 83 mg/dL (ref 0–149)
VLDL Cholesterol Cal: 17 mg/dL (ref 5–40)

## 2017-07-20 LAB — PSA: Prostate Specific Ag, Serum: 0.5 ng/mL (ref 0.0–4.0)

## 2017-07-20 NOTE — Progress Notes (Signed)
Normal labs.  Pt notified through mychart

## 2017-12-05 DIAGNOSIS — R0683 Snoring: Secondary | ICD-10-CM | POA: Diagnosis not present

## 2017-12-05 DIAGNOSIS — E669 Obesity, unspecified: Secondary | ICD-10-CM | POA: Diagnosis not present

## 2017-12-05 DIAGNOSIS — E119 Type 2 diabetes mellitus without complications: Secondary | ICD-10-CM | POA: Diagnosis not present

## 2017-12-05 DIAGNOSIS — G4733 Obstructive sleep apnea (adult) (pediatric): Secondary | ICD-10-CM | POA: Diagnosis not present

## 2018-01-05 DIAGNOSIS — R0683 Snoring: Secondary | ICD-10-CM | POA: Diagnosis not present

## 2018-01-05 DIAGNOSIS — E119 Type 2 diabetes mellitus without complications: Secondary | ICD-10-CM | POA: Diagnosis not present

## 2018-01-05 DIAGNOSIS — E669 Obesity, unspecified: Secondary | ICD-10-CM | POA: Diagnosis not present

## 2018-01-05 DIAGNOSIS — G4733 Obstructive sleep apnea (adult) (pediatric): Secondary | ICD-10-CM | POA: Diagnosis not present

## 2018-01-16 DIAGNOSIS — R69 Illness, unspecified: Secondary | ICD-10-CM | POA: Diagnosis not present

## 2018-02-05 DIAGNOSIS — E669 Obesity, unspecified: Secondary | ICD-10-CM | POA: Diagnosis not present

## 2018-02-05 DIAGNOSIS — R0683 Snoring: Secondary | ICD-10-CM | POA: Diagnosis not present

## 2018-02-05 DIAGNOSIS — G4733 Obstructive sleep apnea (adult) (pediatric): Secondary | ICD-10-CM | POA: Diagnosis not present

## 2018-02-05 DIAGNOSIS — E119 Type 2 diabetes mellitus without complications: Secondary | ICD-10-CM | POA: Diagnosis not present

## 2018-03-13 ENCOUNTER — Other Ambulatory Visit: Payer: Self-pay

## 2018-03-13 ENCOUNTER — Ambulatory Visit (INDEPENDENT_AMBULATORY_CARE_PROVIDER_SITE_OTHER): Payer: Medicare HMO | Admitting: Family Medicine

## 2018-03-13 ENCOUNTER — Encounter: Payer: Self-pay | Admitting: Family Medicine

## 2018-03-13 VITALS — BP 114/72 | HR 59 | Temp 98.5°F | Ht 66.8 in | Wt 362.0 lb

## 2018-03-13 DIAGNOSIS — G4733 Obstructive sleep apnea (adult) (pediatric): Secondary | ICD-10-CM | POA: Diagnosis not present

## 2018-03-13 DIAGNOSIS — Z23 Encounter for immunization: Secondary | ICD-10-CM | POA: Diagnosis not present

## 2018-03-13 DIAGNOSIS — E119 Type 2 diabetes mellitus without complications: Secondary | ICD-10-CM | POA: Diagnosis not present

## 2018-03-13 DIAGNOSIS — E669 Obesity, unspecified: Secondary | ICD-10-CM | POA: Diagnosis not present

## 2018-03-13 DIAGNOSIS — R0683 Snoring: Secondary | ICD-10-CM | POA: Diagnosis not present

## 2018-03-13 NOTE — Patient Instructions (Signed)
Pneumococcal Conjugate Vaccine (PCV13) What You Need to Know 1. Why get vaccinated? Vaccination can protect both children and adults from pneumococcal disease. Pneumococcal disease is caused by bacteria that can spread from person to person through close contact. It can cause ear infections, and it can also lead to more serious infections of the:  Lungs (pneumonia),  Blood (bacteremia), and  Covering of the brain and spinal cord (meningitis).  Pneumococcal pneumonia is most common among adults. Pneumococcal meningitis can cause deafness and brain damage, and it kills about 1 child in 10 who get it. Anyone can get pneumococcal disease, but children under 2 years of age and adults 65 years and older, people with certain medical conditions, and cigarette smokers are at the highest risk. Before there was a vaccine, the [place] saw:  more than 700 cases of meningitis,  about 13,000 blood infections,  about 5 million ear infections, and  about 200 deaths  in children under 5 each year from pneumococcal disease. Since vaccine became available, severe pneumococcal disease in these children has fallen by 88%. About 18,000 older adults die of pneumococcal disease each year in the [place]. Treatment of pneumococcal infections with penicillin and other drugs is not as effective as it used to be, because some strains of the disease have become resistant to these drugs. This makes prevention of the disease, through vaccination, even more important. 2. PCV13 vaccine Pneumococcal conjugate vaccine (called PCV13) protects against 13 types of pneumococcal bacteria. PCV13 is routinely given to children at 2, 4, 6, and 12-15 months of age. It is also recommended for children and adults 2 to 64 years of age with certain health conditions, and for all adults 65 years of age and older. Your doctor can give you details. 3. Some people should not get this vaccine Anyone who has ever had a  life-threatening allergic reaction to a dose of this vaccine, to an earlier pneumococcal vaccine called PCV7, or to any vaccine containing diphtheria toxoid (for example, DTaP), should not get PCV13. Anyone with a severe allergy to any component of PCV13 should not get the vaccine. Tell your doctor if the person being vaccinated has any severe allergies. If the person scheduled for vaccination is not feeling well, your healthcare provider might decide to reschedule the shot on another day. 4. Risks of a vaccine reaction With any medicine, including vaccines, there is a chance of reactions. These are usually mild and go away on their own, but serious reactions are also possible. Problems reported following PCV13 varied by age and dose in the series. The most common problems reported among children were:  About half became drowsy after the shot, had a temporary loss of appetite, or had redness or tenderness where the shot was given.  About 1 out of 3 had swelling where the shot was given.  About 1 out of 3 had a mild fever, and about 1 in 20 had a fever over 102.2F.  Up to about 8 out of 10 became fussy or irritable.  Adults have reported pain, redness, and swelling where the shot was given; also mild fever, fatigue, headache, chills, or muscle pain. Young children who get PCV13 along with inactivated flu vaccine at the same time may be at increased risk for seizures caused by fever. Ask your doctor for more information. Problems that could happen after any vaccine:  People sometimes faint after a medical procedure, including vaccination. Sitting or lying down for about 15 minutes can help prevent   fainting, and injuries caused by a fall. Tell your doctor if you feel dizzy, or have vision changes or ringing in the ears.  Some older children and adults get severe pain in the shoulder and have difficulty moving the arm where a shot was given. This happens very rarely.  Any medication can cause a  severe allergic reaction. Such reactions from a vaccine are very rare, estimated at about 1 in a million doses, and would happen within a few minutes to a few hours after the vaccination. As with any medicine, there is a very small chance of a vaccine causing a serious injury or death. The safety of vaccines is always being monitored. For more information, visit: www.cdc.gov/vaccinesafety/ 5. What if there is a serious reaction? What should I look for? Look for anything that concerns you, such as signs of a severe allergic reaction, very high fever, or unusual behavior. Signs of a severe allergic reaction can include hives, swelling of the face and throat, difficulty breathing, a fast heartbeat, dizziness, and weakness-usually within a few minutes to a few hours after the vaccination. What should I do?  If you think it is a severe allergic reaction or other emergency that can't wait, call 9-1-1 or get the person to the nearest hospital. Otherwise, call your doctor.  Reactions should be reported to the Vaccine Adverse Event Reporting System (VAERS). Your doctor should file this report, or you can do it yourself through the VAERS web site at www.vaers.hhs.gov, or by calling 1-800-822-7967. ? VAERS does not give medical advice. 6. The National Vaccine Injury Compensation Program The National Vaccine Injury Compensation Program (VICP) is a federal program that was created to compensate people who may have been injured by certain vaccines. Persons who believe they may have been injured by a vaccine can learn about the program and about filing a claim by calling 1-800-338-2382 or visiting the VICP website at www.hrsa.gov/vaccinecompensation. There is a time limit to file a claim for compensation. 7. How can I learn more?  Ask your healthcare provider. He or she can give you the vaccine package insert or suggest other sources of information.  Call your local or state health department.  Contact the  Centers for Disease Control and Prevention (CDC): ? Call 1-800-232-4636 (1-800-CDC-INFO) or ? Visit CDC's website at www.cdc.gov/vaccines Vaccine Information Statement, PCV13 Vaccine (04/11/2014) This information is not intended to replace advice given to you by your health care provider. Make sure you discuss any questions you have with your health care provider. Document Released: 03/21/2006 Document Revised: 02/12/2016 Document Reviewed: 02/12/2016 Elsevier Interactive Patient Education  2017 [employer]  

## 2018-03-13 NOTE — Progress Notes (Signed)
BP 114/72   Pulse (!) 59   Temp 98.5 F (36.9 C) (Oral)   Ht 5' 6.8" (1.697 m)   Wt (!) 362 lb (164.2 kg)   SpO2 95%   BMI 57.04 kg/m    Subjective:    Patient ID: Robert Rasmussen, male    DOB: 1951/01/02, 67 y.o.   MRN: [id number]  HPI: Robert Rasmussen is a 67 y.o. male  Chief Complaint  Patient presents with  . CPAP     pt statesface to face appt required by insurance   Here today to discuss medical necessity for his CPAP. Dx'd with sleep apnea earlier this year and has benefited significantly from daily use of his CPAP. States he doesn't think he could sleep without it now that he's had it. Denies discomfort, poor fit, dry mouth, or other issues/complaints.   Relevant past medical, surgical, family and social history reviewed and updated as indicated. Interim medical history since our last visit reviewed. Allergies and medications reviewed and updated.  Review of Systems  Per HPI unless specifically indicated above     Objective:    BP 114/72   Pulse (!) 59   Temp 98.5 F (36.9 C) (Oral)   Ht 5' 6.8" (1.697 m)   Wt (!) 362 lb (164.2 kg)   SpO2 95%   BMI 57.04 kg/m   Wt Readings from Last 3 Encounters:  03/13/18 (!) 362 lb (164.2 kg)  07/19/17 (!) 354 lb (160.6 kg)  06/08/17 (!) 356 lb 12.8 oz (161.8 kg)    Physical Exam  Constitutional: He is oriented to person, place, and time. He appears well-developed and well-nourished. No distress.  HENT:  Head: Atraumatic.  Mouth/Throat: Oropharynx is clear and moist.  Eyes: Conjunctivae and EOM are normal.  Neck: Normal range of motion. Neck supple.  Cardiovascular: Normal rate and regular rhythm.  Pulmonary/Chest: Effort normal and breath sounds normal.  Musculoskeletal: Normal range of motion.  Neurological: He is alert and oriented to person, place, and time.  Skin: Skin is warm and dry.  Psychiatric: He has a normal mood and affect. His behavior is normal.  Nursing note and vitals reviewed.   Results for  orders placed or performed in visit on 07/19/17  CBC with Differential/Platelet  Result Value Ref Range   WBC 6.2 3.4 - 10.8 x10E3/uL   RBC 4.50 4.14 - 5.80 x10E6/uL   Hemoglobin 15.0 13.0 - 17.7 g/dL   Hematocrit 13.2 44.0 - 51.0 %   MCV 95 79 - 97 fL   MCH 33.3 (H) 26.6 - 33.0 pg   MCHC 35.0 31.5 - 35.7 g/dL   RDW 10.2 72.5 - 36.6 %   Platelets 205 150 - 379 x10E3/uL   Neutrophils 50 Not Estab. %   Lymphs 37 Not Estab. %   Monocytes 8 Not Estab. %   Eos 4 Not Estab. %   Basos 1 Not Estab. %   Neutrophils Absolute 3.2 1.4 - 7.0 x10E3/uL   Lymphocytes Absolute 2.3 0.7 - 3.1 x10E3/uL   Monocytes Absolute 0.5 0.1 - 0.9 x10E3/uL   EOS (ABSOLUTE) 0.2 0.0 - 0.4 x10E3/uL   Basophils Absolute 0.1 0.0 - 0.2 x10E3/uL   Immature Granulocytes 0 Not Estab. %   Immature Grans (Abs) 0.0 0.0 - 0.1 x10E3/uL  Comprehensive metabolic panel  Result Value Ref Range   Glucose 95 65 - 99 mg/dL   BUN 16 8 - 27 mg/dL   Creatinine, Ser 4.40 0.76 - 1.27 mg/dL  GFR calc non Af Amer 79 >59 mL/min/1.73   GFR calc Af Amer 92 >59 mL/min/1.73   BUN/Creatinine Ratio 16 10 - 24   Sodium 141 134 - 144 mmol/L   Potassium 4.4 3.5 - 5.2 mmol/L   Chloride 103 96 - 106 mmol/L   CO2 22 20 - 29 mmol/L   Calcium 9.4 8.6 - 10.2 mg/dL   Total Protein 6.5 6.0 - 8.5 g/dL   Albumin 4.3 3.6 - 4.8 g/dL   Globulin, Total 2.2 1.5 - 4.5 g/dL   Albumin/Globulin Ratio 2.0 1.2 - 2.2   Bilirubin Total 0.6 0.0 - 1.2 mg/dL   Alkaline Phosphatase 53 39 - 117 IU/L   AST 24 0 - 40 IU/L   ALT 25 0 - 44 IU/L  Lipid Panel w/o Chol/HDL Ratio  Result Value Ref Range   Cholesterol, Total 181 100 - 199 mg/dL   Triglycerides 83 0 - 149 mg/dL   HDL 42 >16 mg/dL   VLDL Cholesterol Cal 17 5 - 40 mg/dL   LDL Calculated 109 (H) 0 - 99 mg/dL  TSH  Result Value Ref Range   TSH 1.650 0.450 - 4.500 uIU/mL  PSA  Result Value Ref Range   Prostate Specific Ag, Serum 0.5 0.0 - 4.0 ng/mL      Assessment & Plan:   Problem List Items  Addressed This Visit      Respiratory   Sleep apnea - Primary    Benefiting greatly from daily CPAP use. Continue current regimen       Other Visit Diagnoses    Flu vaccine need       Relevant Orders   Flu vaccine HIGH DOSE PF   Need for vaccination against Streptococcus pneumoniae using pneumococcal conjugate vaccine 13       Relevant Orders   Pneumococcal conjugate vaccine 13-valent IM       Follow up plan: Return for as scheduled.

## 2018-03-13 NOTE — Assessment & Plan Note (Signed)
Benefiting greatly from daily CPAP use. Continue current regimen

## 2018-03-28 ENCOUNTER — Encounter: Payer: Self-pay | Admitting: Family Medicine

## 2018-04-11 DIAGNOSIS — G4733 Obstructive sleep apnea (adult) (pediatric): Secondary | ICD-10-CM | POA: Diagnosis not present

## 2018-04-13 DIAGNOSIS — E119 Type 2 diabetes mellitus without complications: Secondary | ICD-10-CM | POA: Diagnosis not present

## 2018-04-13 DIAGNOSIS — G4733 Obstructive sleep apnea (adult) (pediatric): Secondary | ICD-10-CM | POA: Diagnosis not present

## 2018-04-13 DIAGNOSIS — E669 Obesity, unspecified: Secondary | ICD-10-CM | POA: Diagnosis not present

## 2018-04-13 DIAGNOSIS — R0683 Snoring: Secondary | ICD-10-CM | POA: Diagnosis not present

## 2018-04-24 DIAGNOSIS — H25813 Combined forms of age-related cataract, bilateral: Secondary | ICD-10-CM | POA: Diagnosis not present

## 2018-05-13 DIAGNOSIS — G4733 Obstructive sleep apnea (adult) (pediatric): Secondary | ICD-10-CM | POA: Diagnosis not present

## 2018-05-13 DIAGNOSIS — E119 Type 2 diabetes mellitus without complications: Secondary | ICD-10-CM | POA: Diagnosis not present

## 2018-05-13 DIAGNOSIS — R0683 Snoring: Secondary | ICD-10-CM | POA: Diagnosis not present

## 2018-05-13 DIAGNOSIS — E669 Obesity, unspecified: Secondary | ICD-10-CM | POA: Diagnosis not present

## 2018-06-13 DIAGNOSIS — E669 Obesity, unspecified: Secondary | ICD-10-CM | POA: Diagnosis not present

## 2018-06-13 DIAGNOSIS — R0683 Snoring: Secondary | ICD-10-CM | POA: Diagnosis not present

## 2018-06-13 DIAGNOSIS — E119 Type 2 diabetes mellitus without complications: Secondary | ICD-10-CM | POA: Diagnosis not present

## 2018-06-13 DIAGNOSIS — G4733 Obstructive sleep apnea (adult) (pediatric): Secondary | ICD-10-CM | POA: Diagnosis not present

## 2018-07-14 DIAGNOSIS — E669 Obesity, unspecified: Secondary | ICD-10-CM | POA: Diagnosis not present

## 2018-07-14 DIAGNOSIS — G4733 Obstructive sleep apnea (adult) (pediatric): Secondary | ICD-10-CM | POA: Diagnosis not present

## 2018-07-14 DIAGNOSIS — E119 Type 2 diabetes mellitus without complications: Secondary | ICD-10-CM | POA: Diagnosis not present

## 2018-07-14 DIAGNOSIS — R0683 Snoring: Secondary | ICD-10-CM | POA: Diagnosis not present

## 2018-07-20 DIAGNOSIS — R69 Illness, unspecified: Secondary | ICD-10-CM | POA: Diagnosis not present

## 2018-07-21 ENCOUNTER — Encounter: Payer: Medicare HMO | Admitting: Nurse Practitioner

## 2018-07-21 ENCOUNTER — Encounter: Payer: BLUE CROSS/BLUE SHIELD | Admitting: Unknown Physician Specialty

## 2018-07-30 ENCOUNTER — Encounter: Payer: Self-pay | Admitting: Nurse Practitioner

## 2018-07-30 DIAGNOSIS — E559 Vitamin D deficiency, unspecified: Secondary | ICD-10-CM | POA: Insufficient documentation

## 2018-08-01 ENCOUNTER — Ambulatory Visit (INDEPENDENT_AMBULATORY_CARE_PROVIDER_SITE_OTHER): Payer: Medicare HMO | Admitting: Nurse Practitioner

## 2018-08-01 ENCOUNTER — Encounter: Payer: Self-pay | Admitting: Nurse Practitioner

## 2018-08-01 ENCOUNTER — Other Ambulatory Visit: Payer: Self-pay

## 2018-08-01 VITALS — BP 109/73 | HR 66 | Temp 98.2°F | Ht 66.93 in | Wt 358.0 lb

## 2018-08-01 DIAGNOSIS — G4733 Obstructive sleep apnea (adult) (pediatric): Secondary | ICD-10-CM | POA: Diagnosis not present

## 2018-08-01 DIAGNOSIS — Z0000 Encounter for general adult medical examination without abnormal findings: Secondary | ICD-10-CM | POA: Diagnosis not present

## 2018-08-01 DIAGNOSIS — E559 Vitamin D deficiency, unspecified: Secondary | ICD-10-CM | POA: Diagnosis not present

## 2018-08-01 DIAGNOSIS — E668 Other obesity: Secondary | ICD-10-CM | POA: Diagnosis not present

## 2018-08-01 DIAGNOSIS — Z125 Encounter for screening for malignant neoplasm of prostate: Secondary | ICD-10-CM | POA: Diagnosis not present

## 2018-08-01 DIAGNOSIS — M5430 Sciatica, unspecified side: Secondary | ICD-10-CM | POA: Diagnosis not present

## 2018-08-01 DIAGNOSIS — I10 Essential (primary) hypertension: Secondary | ICD-10-CM

## 2018-08-01 DIAGNOSIS — E782 Mixed hyperlipidemia: Secondary | ICD-10-CM | POA: Diagnosis not present

## 2018-08-01 DIAGNOSIS — K219 Gastro-esophageal reflux disease without esophagitis: Secondary | ICD-10-CM | POA: Diagnosis not present

## 2018-08-01 DIAGNOSIS — M25551 Pain in right hip: Secondary | ICD-10-CM | POA: Insufficient documentation

## 2018-08-01 MED ORDER — ZOSTER VAC RECOMB ADJUVANTED 50 MCG/0.5ML IM SUSR: 0.5000 mL | Freq: Once | INTRAMUSCULAR | 0 refills | Status: AC

## 2018-08-01 NOTE — Assessment & Plan Note (Signed)
Below goal today without medication.  Continue to monitor and adjust POC as indicated.  CBC and CMP today.

## 2018-08-01 NOTE — Assessment & Plan Note (Signed)
Labs in 2016 noted Vitamin D level 19.7, will recheck this today.

## 2018-08-01 NOTE — Assessment & Plan Note (Signed)
Bilateral improves with movement.  Have recommended stretching before and after exercise + You Tube videos for yoga at home.  If worsening or continued symptoms will consider medication or imaging.

## 2018-08-01 NOTE — Progress Notes (Signed)
BP 109/73 (BP Location: Left Arm, Patient Position: Sitting, Cuff Size: Large)   Pulse 66   Temp 98.2 F (36.8 C) (Oral)   Ht 5' 6.93" (1.7 m)   Wt (!) 358 lb (162.4 kg)   SpO2 94%   BMI 56.19 kg/m    Subjective:    Patient ID: Robert Rasmussen, male    DOB: 1950-09-15, 68 y.o.   MRN: [id number]  HPI: Robert Rasmussen is a 68 y.o. male presenting on 08/01/2018 for comprehensive medical examination. Current medical complaints include:none  He currently lives with: Interim Problems from his last visit: no   HYPERTENSION / HYPERLIPIDEMIA Satisfied with current treatment? none Duration of hypertension: years BP monitoring frequency: not checking BP range:  Duration of hyperlipidemia: years Cholesterol medication side effects: none Cholesterol supplements: none Aspirin: no Recent stressors: no Recurrent headaches: no Visual changes: no Palpitations: no Dyspnea: no Chest pain: no Lower extremity edema: no Dizzy/lightheaded: no  The 10-year ASCVD risk score [name], et al., 2013) is: 10%   Values used to calculate the score:     Age: 49 years     Sex: Male     Is Non-Hispanic African American: No     Diabetic: No     Tobacco smoker: No     Systolic Blood Pressure: 109 mmHg     Is BP treated: No     HDL Cholesterol: 42 mg/dL     Total Cholesterol: 181 mg/dL  OSA: Has been using CPAP regularly. Denies any concerns.  GERD Has not had symptoms for 5 years, took medications briefly and this improved.  No further medications. GERD control status: stable  Satisfied with current treatment? yes Heartburn frequency: none Dysphagia: no Odynophagia:  no Hematemesis: no Blood in stool: no EGD: no   SCIATIC NERVE DISCOMFORT: Recently retired and has been exercising at gym.  Developed some sciatic, most noted below bilateral buttocks.  Notices it morning after working out, walking bothers it most (outside walking).  States no issues with lifting weights or riding on bike.   Has not been taking consistent medication at home, once every 3 weeks takes Aleeve and this "marginally" helps.  Does not stretch prior to or after working.  Sciatic discomfort notices it in morning, as since starting CPAP can not roll on sides and wakes up stiff in morning.  Notices it 4-5 mornings a week, improves throughout the day without intervention.  Flares up more while walking or if standing a long time.    Functional Status Survey: Is the patient deaf or have difficulty hearing?: No Does the patient have difficulty seeing, even when wearing glasses/contacts?: No Does the patient have difficulty concentrating, remembering, or making decisions?: No Does the patient have difficulty walking or climbing stairs?: No Does the patient have difficulty dressing or bathing?: No Does the patient have difficulty doing errands alone such as visiting a doctor's office or shopping?: No  FALL RISK: Fall Risk  08/01/2018 07/19/2017 07/14/2016  Falls in the past year? 0 No No  Number falls in past yr: 0 - -  Injury with Fall? 0 - -    Depression Screen Depression screen [facility] 2/9 08/01/2018 08/01/2018 07/19/2017 07/14/2016  Decreased Interest 0 0 0 0  Down, Depressed, Hopeless 0 0 0 0  PHQ - 2 Score 0 0 0 0  Altered sleeping 0 0 0 -  Tired, decreased energy 0 0 0 -  Change in appetite 0 0 0 -  Feeling  bad or failure about yourself  0 0 0 -  Trouble concentrating 0 0 0 -  Moving slowly or fidgety/restless 0 0 0 -  Suicidal thoughts 0 0 0 -  PHQ-9 Score 0 0 0 -  Difficult doing work/chores - Not difficult at all - -    Past Medical History:  Past Medical History:  Diagnosis Date  . Allergy    seasonal  . Amebic dysentery   . Asthma   . Cellulitis   . GERD (gastroesophageal reflux disease)     Surgical History:  Past Surgical History:  Procedure Laterality Date  . amebic dysentery    . TONSILLECTOMY      Medications:  Current Outpatient Medications on File Prior to Visit  Medication Sig   . Multiple Vitamins-Minerals (ONE-A-DAY MENS 50+ ADVANTAGE) TABS Take 1 tablet by mouth daily.   No current facility-administered medications on file prior to visit.     Allergies:  No Known Allergies  Social History:  Social History   Socioeconomic History  . Marital status: Divorced    Spouse name: Not on file  . Number of children: Not on file  . Years of education: Not on file  . Highest education level: Not on file  Occupational History  . Not on file  Social Needs  . Financial resource strain: Not on file  . Food insecurity:    Worry: Not on file    Inability: Not on file  . Transportation needs:    Medical: Not on file    Non-medical: Not on file  Tobacco Use  . Smoking status: Never Smoker  . Smokeless tobacco: Never Used  Substance and Sexual Activity  . Alcohol use: Yes    Alcohol/week: 0.0 standard drinks    Comment: pt states he has a drink about every 4 weeks  . Drug use: No  . Sexual activity: Yes  Lifestyle  . Physical activity:    Days per week: Not on file    Minutes per session: Not on file  . Stress: Not on file  Relationships  . Social connections:    Talks on phone: Not on file    Gets together: Not on file    Attends religious service: Not on file    Active member of club or organization: Not on file    Attends meetings of clubs or organizations: Not on file    Relationship status: Not on file  . Intimate partner violence:    Fear of current or ex partner: Not on file    Emotionally abused: Not on file    Physically abused: Not on file    Forced sexual activity: Not on file  Other Topics Concern  . Not on file  Social History Narrative  . Not on file   Social History   Tobacco Use  Smoking Status Never Smoker  Smokeless Tobacco Never Used   Social History   Substance and Sexual Activity  Alcohol Use Yes  . Alcohol/week: 0.0 standard drinks   Comment: pt states he has a drink about every 4 weeks    Family History:    Family History  Problem Relation Age of Onset  . Cancer Mother        breast  . Cancer Father        skin  . Cancer Sister        breast  . Heart disease Paternal Grandmother        heart attack  Past medical history, surgical history, medications, allergies, family history and social history reviewed with patient today and changes made to appropriate areas of the chart.   Review of Systems - Negative All other ROS negative except what is listed above and in the HPI.      Objective:    BP 109/73 (BP Location: Left Arm, Patient Position: Sitting, Cuff Size: Large)   Pulse 66   Temp 98.2 F (36.8 C) (Oral)   Ht 5' 6.93" (1.7 m)   Wt (!) 358 lb (162.4 kg)   SpO2 94%   BMI 56.19 kg/m   Wt Readings from Last 3 Encounters:  08/01/18 (!) 358 lb (162.4 kg)  03/13/18 (!) 362 lb (164.2 kg)  07/19/17 (!) 354 lb (160.6 kg)    Physical Exam Vitals signs and nursing note reviewed.  Constitutional:      General: He is awake.     Appearance: He is well-developed. He is obese.  HENT:     Head: Normocephalic and atraumatic.     Right Ear: Hearing, tympanic membrane, ear canal and external ear normal. No drainage.     Left Ear: Hearing, tympanic membrane, ear canal and external ear normal. No drainage.     Nose: Nose normal.     Right Sinus: No maxillary sinus tenderness or frontal sinus tenderness.     Left Sinus: No maxillary sinus tenderness or frontal sinus tenderness.     Mouth/Throat:     Mouth: Mucous membranes are moist.     Pharynx: Oropharynx is clear. Uvula midline.  Eyes:     General: Lids are normal.        Right eye: No discharge.        Left eye: No discharge.     Conjunctiva/sclera: Conjunctivae normal.     Pupils: Pupils are equal, round, and reactive to light.  Neck:     Musculoskeletal: Normal range of motion and neck supple.     Thyroid: No thyromegaly.     Vascular: No carotid bruit or JVD.     Trachea: Trachea normal.  Cardiovascular:     Rate and  Rhythm: Normal rate and regular rhythm.     Heart sounds: Normal heart sounds, S1 normal and S2 normal. No murmur. No gallop.   Pulmonary:     Effort: Pulmonary effort is normal.     Breath sounds: Normal breath sounds.  Abdominal:     General: Bowel sounds are normal.     Palpations: Abdomen is soft. There is no hepatomegaly or splenomegaly.  Musculoskeletal: Normal range of motion.     Right lower leg: No edema.     Left lower leg: No edema.  Lymphadenopathy:     Head:     Right side of head: No submental, submandibular, tonsillar or preauricular adenopathy.     Left side of head: No submental, submandibular, tonsillar or preauricular adenopathy.     Cervical: No cervical adenopathy.  Skin:    General: Skin is warm and dry.     Capillary Refill: Capillary refill takes less than 2 seconds.     Findings: No rash.  Neurological:     Mental Status: He is alert and oriented to person, place, and time.     Cranial Nerves: Cranial nerves are intact.     Motor: Motor function is intact.     Coordination: Coordination is intact.     Gait: Gait is intact.     Deep Tendon Reflexes: Reflexes are normal and  symmetric.     Reflex Scores:      Brachioradialis reflexes are 2+ on the right side and 2+ on the left side.      Patellar reflexes are 2+ on the right side and 2+ on the left side. Psychiatric:        Attention and Perception: Attention normal.        Mood and Affect: Mood normal.        Speech: Speech normal.        Behavior: Behavior normal. Behavior is cooperative.        Thought Content: Thought content normal.        Judgment: Judgment normal.     Results for orders placed or performed in visit on 07/19/17  CBC with Differential/Platelet  Result Value Ref Range   WBC 6.2 3.4 - 10.8 x10E3/uL   RBC 4.50 4.14 - 5.80 x10E6/uL   Hemoglobin 15.0 13.0 - 17.7 g/dL   Hematocrit 16.1 09.6 - 51.0 %   MCV 95 79 - 97 fL   MCH 33.3 (H) 26.6 - 33.0 pg   MCHC 35.0 31.5 - 35.7 g/dL    RDW 04.5 40.9 - 81.1 %   Platelets 205 150 - 379 x10E3/uL   Neutrophils 50 Not Estab. %   Lymphs 37 Not Estab. %   Monocytes 8 Not Estab. %   Eos 4 Not Estab. %   Basos 1 Not Estab. %   Neutrophils Absolute 3.2 1.4 - 7.0 x10E3/uL   Lymphocytes Absolute 2.3 0.7 - 3.1 x10E3/uL   Monocytes Absolute 0.5 0.1 - 0.9 x10E3/uL   EOS (ABSOLUTE) 0.2 0.0 - 0.4 x10E3/uL   Basophils Absolute 0.1 0.0 - 0.2 x10E3/uL   Immature Granulocytes 0 Not Estab. %   Immature Grans (Abs) 0.0 0.0 - 0.1 x10E3/uL  Comprehensive metabolic panel  Result Value Ref Range   Glucose 95 65 - 99 mg/dL   BUN 16 8 - 27 mg/dL   Creatinine, Ser 9.14 0.76 - 1.27 mg/dL   GFR calc non Af Amer 79 >59 mL/min/1.73   GFR calc Af Amer 92 >59 mL/min/1.73   BUN/Creatinine Ratio 16 10 - 24   Sodium 141 134 - 144 mmol/L   Potassium 4.4 3.5 - 5.2 mmol/L   Chloride 103 96 - 106 mmol/L   CO2 22 20 - 29 mmol/L   Calcium 9.4 8.6 - 10.2 mg/dL   Total Protein 6.5 6.0 - 8.5 g/dL   Albumin 4.3 3.6 - 4.8 g/dL   Globulin, Total 2.2 1.5 - 4.5 g/dL   Albumin/Globulin Ratio 2.0 1.2 - 2.2   Bilirubin Total 0.6 0.0 - 1.2 mg/dL   Alkaline Phosphatase 53 39 - 117 IU/L   AST 24 0 - 40 IU/L   ALT 25 0 - 44 IU/L  Lipid Panel w/o Chol/HDL Ratio  Result Value Ref Range   Cholesterol, Total 181 100 - 199 mg/dL   Triglycerides 83 0 - 149 mg/dL   HDL 42 >78 mg/dL   VLDL Cholesterol Cal 17 5 - 40 mg/dL   LDL Calculated 295 (H) 0 - 99 mg/dL  TSH  Result Value Ref Range   TSH 1.650 0.450 - 4.500 uIU/mL  PSA  Result Value Ref Range   Prostate Specific Ag, Serum 0.5 0.0 - 4.0 ng/mL      Assessment & Plan:   Problem List Items Addressed This Visit      Cardiovascular and Mediastinum   Benign hypertension    Below goal today  without medication.  Continue to monitor and adjust POC as indicated.  CBC and CMP today.      Relevant Orders   CBC with Differential/Platelet   Comprehensive metabolic panel     Respiratory   Sleep apnea     Continues to use CPAP nightly with benefit.  Continue to monitor and encourage use.        Digestive   GERD (gastroesophageal reflux disease)    Chronic, ongoing.  Continue diet focus, no medications.         Nervous and Auditory   Sciatic pain    Bilateral improves with movement.  Have recommended stretching before and after exercise + You Tube videos for yoga at home.  If worsening or continued symptoms will consider medication or imaging.        Other   Extreme obesity    Has started working out at gym and focusing on exercise with recent retirement.  Continue to encourage this focus daily.      Vitamin D deficiency    Labs in 2016 noted Vitamin D level 19.7, will recheck this today.      Relevant Orders   VITAMIN D 25 Hydroxy (Vit-D Deficiency, Fractures)   Mixed hyperlipidemia    ASCVD 10% on 2019 labs.  At this time patient does not want to initiate medication.  Wishes to focus on diet and exercise.  Discussed risks/benefits of statin therapy.  Check lipid panel today and have return in 6 months to recheck and assess diet/weight loss.      Relevant Orders   Lipid Panel w/o Chol/HDL Ratio    Other Visit Diagnoses    Annual physical exam    -  Primary   Relevant Orders   TSH   Prostate cancer screening       Relevant Orders   PSA       Discussed aspirin prophylaxis for myocardial infarction prevention and decision was it was not indicated  LABORATORY TESTING:  Health maintenance labs ordered today as discussed above.   The natural history of prostate cancer and ongoing controversy regarding screening and potential treatment outcomes of prostate cancer has been discussed with the patient. The meaning of a false positive PSA and a false negative PSA has been discussed. He indicates understanding of the limitations of this screening test and wishes to proceed with screening PSA testing.   IMMUNIZATIONS:   - Tdap: Tetanus vaccination status reviewed: up to  date - Influenza: Up to date, had in October 2019 - Pneumovax: Not applicable --- due October 2020 - Prevnar: Up to date, had in October 2019 - Zostavax vaccine: ordered Shingrix  SCREENING: - Colonoscopy: Up to date , had Cologuard and due again in 2021 Discussed with patient purpose of the colonoscopy is to detect colon cancer at curable precancerous or early stages  - AAA Screening: Not applicable  -Hearing Test: Not applicable  -Spirometry: Not applicable   PATIENT COUNSELING:    Sexuality: Discussed sexually transmitted diseases, partner selection, use of condoms, avoidance of unintended pregnancy  and contraceptive alternatives.   Advised to avoid cigarette smoking.  I discussed with the patient that most people either abstain from alcohol or drink within safe limits (<=14/week and <=4 drinks/occasion for males, <=7/weeks and <= 3 drinks/occasion for females) and that the risk for alcohol disorders and other health effects rises proportionally with the number of drinks per week and how often a drinker exceeds daily limits.  Discussed cessation/primary prevention of drug use  and availability of treatment for abuse.   Diet: Encouraged to adjust caloric intake to maintain  or achieve ideal body weight, to reduce intake of dietary saturated fat and total fat, to limit sodium intake by avoiding high sodium foods and not adding table salt, and to maintain adequate dietary potassium and calcium preferably from fresh fruits, vegetables, and low-fat dairy products.    stressed the importance of regular exercise  Injury prevention: Discussed safety belts, safety helmets, smoke detector, smoking near bedding or upholstery.   Dental health: Discussed importance of regular tooth brushing, flossing, and dental visits.   Follow up plan: NEXT PREVENTATIVE PHYSICAL DUE IN 1 YEAR. Return in about 6 months (around 01/30/2019) for HLD.   NOTE WRITTEN BY [employer] DNP STUDENT.  ASSESSMENT AND PLAN OF  CARE REVIEWED WITH STUDENT, AGREE WITH ABOVE FINDINGS AND PLAN.

## 2018-08-01 NOTE — Assessment & Plan Note (Signed)
Continues to use CPAP nightly with benefit.  Continue to monitor and encourage use.

## 2018-08-01 NOTE — Patient Instructions (Signed)
Over time and in combination with inflammation and other factors, this contributes to plaque which in turn may lead to stroke and/or heart attack down the road.  Sometimes high LDL is primarily genetic, and people might be eating all the right foods but still have high numbers.  Other times, there is room for improvement in one's diet and eating healthier can bring this number down and potentially reduce one's risk of heart attack and/or stroke. To reduce your LDL, Remember - more fruits and vegetables, more fish, and limitnred meat and dairy products.  More soy, nuts, beans, barley, lentils, oats and plant sterol ester enriched margarine instead of butter.  I also encourage eliminating sugar and processed food.  Remember, shop on the outside of the grocery store and visit your [employer].   If you would like to talk with me about dietary changes plus or minus medications for your cholesterol, please let me know. We should recheck your cholesterol in 6 months.   Fat and Cholesterol Restricted Eating Plan Getting too much fat and cholesterol in your diet may cause health problems. Choosing the right foods helps keep your fat and cholesterol at normal levels. This can keep you from getting certain diseases. Your doctor may recommend an eating plan that includes:  Total fat: ______% or less of total calories a day.  Saturated fat: ______% or less of total calories a day.  Cholesterol: less than _________mg a day.  Fiber: ______g a day. What are tips for following this plan? Meal planning  At meals, divide your plate into four equal parts: ? Fill one-half of your plate with vegetables and green salads. ? Fill one-fourth of your plate with whole grains. ? Fill one-fourth of your plate with low-fat (lean) protein foods.  Eat fish that is high in omega-3 fats at least two times a week. This includes mackerel, tuna, sardines, and salmon.  Eat foods that are high in fiber, such as whole grains,  beans, apples, broccoli, carrots, peas, and barley. General tips   Work with your doctor to lose weight if you need to.  Avoid: ? Foods with added sugar. ? Fried foods. ? Foods with partially hydrogenated oils.  Limit alcohol intake to no more than 1 drink a day for nonpregnant women and 2 drinks a day for men. One drink equals 12 oz of beer, 5 oz of wine, or 1 oz of hard liquor. Reading food labels  Check food labels for: ? Trans fats. ? Partially hydrogenated oils. ? Saturated fat (g) in each serving. ? Cholesterol (mg) in each serving. ? Fiber (g) in each serving.  Choose foods with healthy fats, such as: ? Monounsaturated fats. ? Polyunsaturated fats. ? Omega-3 fats.  Choose grain products that have whole grains. Look for the word "whole" as the first word in the ingredient list. Cooking  Cook foods using low-fat methods. These include baking, boiling, grilling, and broiling.  Eat more home-cooked foods. Eat at restaurants and buffets less often.  Avoid cooking using saturated fats, such as butter, cream, palm oil, palm kernel oil, and coconut oil. Recommended foods  Fruits  All fresh, canned (in natural juice), or frozen fruits. Vegetables  Fresh or frozen vegetables (raw, steamed, roasted, or grilled). Green salads. Grains  Whole grains, such as whole wheat or whole grain breads, crackers, cereals, and pasta. Unsweetened oatmeal, bulgur, barley, quinoa, or brown rice. Corn or whole wheat flour tortillas. Meats and other protein foods  Ground beef (85% or leaner), grass-fed  beef, or beef trimmed of fat. Skinless chicken or [place]. Ground chicken or [place]. Pork trimmed of fat. All fish and seafood. Egg whites. Dried beans, peas, or lentils. Unsalted nuts or seeds. Unsalted canned beans. Nut butters without added sugar or oil. Dairy  Low-fat or nonfat dairy products, such as skim or 1% milk, 2% or reduced-fat cheeses, low-fat and fat-free ricotta or cottage  cheese, or plain low-fat and nonfat yogurt. Fats and oils  Tub margarine without trans fats. Light or reduced-fat mayonnaise and salad dressings. Avocado. Olive, canola, sesame, or safflower oils. The items listed above may not be a complete list of foods and beverages you can eat. Contact a dietitian for more information. Foods to avoid Fruits  Canned fruit in heavy syrup. Fruit in cream or butter sauce. Fried fruit. Vegetables  Vegetables cooked in cheese, cream, or butter sauce. Fried vegetables. Grains  White bread. White pasta. White rice. Cornbread. Bagels, pastries, and croissants. Crackers and snack foods that contain trans fat and hydrogenated oils. Meats and other protein foods  Fatty cuts of meat. Ribs, chicken wings, bacon, sausage, bologna, salami, chitterlings, fatback, hot dogs, bratwurst, and packaged lunch meats. Liver and organ meats. Whole eggs and egg yolks. Chicken and [place] with skin. Fried meat. Dairy  Whole or 2% milk, cream, half-and-half, and cream cheese. Whole milk cheeses. Whole-fat or sweetened yogurt. Full-fat cheeses. Nondairy creamers and whipped toppings. Processed cheese, cheese spreads, and cheese curds. Beverages  Alcohol. Sugar-sweetened drinks such as sodas, lemonade, and fruit drinks. Fats and oils  Butter, stick margarine, lard, shortening, ghee, or bacon fat. Coconut, palm kernel, and palm oils. Sweets and desserts  Corn syrup, sugars, honey, and molasses. Candy. Jam and jelly. Syrup. Sweetened cereals. Cookies, pies, cakes, donuts, muffins, and ice cream. The items listed above may not be a complete list of foods and beverages you should avoid. Contact a dietitian for more information. Summary  Choosing the right foods helps keep your fat and cholesterol at normal levels. This can keep you from getting certain diseases.  At meals, fill one-half of your plate with vegetables and green salads.  Eat high-fiber foods, like whole grains,  beans, apples, carrots, peas, and barley.  Limit added sugar, saturated fats, alcohol, and fried foods. This information is not intended to replace advice given to you by your health care provider. Make sure you discuss any questions you have with your health care provider. Document Released: 11/23/2011 Document Revised: 01/25/2018 Document Reviewed: 02/08/2017 Elsevier Interactive Patient Education  2019 [employer].

## 2018-08-01 NOTE — Assessment & Plan Note (Signed)
Chronic, ongoing.  Continue diet focus, no medications.

## 2018-08-01 NOTE — Assessment & Plan Note (Signed)
ASCVD 10% on 2019 labs.  At this time patient does not want to initiate medication.  Wishes to focus on diet and exercise.  Discussed risks/benefits of statin therapy.  Check lipid panel today and have return in 6 months to recheck and assess diet/weight loss.

## 2018-08-01 NOTE — Assessment & Plan Note (Signed)
Has started working out at gym and focusing on exercise with recent retirement.  Continue to encourage this focus daily.

## 2018-08-02 LAB — COMPREHENSIVE METABOLIC PANEL
ALK PHOS: 51 IU/L (ref 39–117)
ALT: 28 IU/L (ref 0–44)
AST: 28 IU/L (ref 0–40)
Albumin/Globulin Ratio: 1.9 (ref 1.2–2.2)
Albumin: 4.3 g/dL (ref 3.8–4.8)
BUN/Creatinine Ratio: 14 (ref 10–24)
BUN: 15 mg/dL (ref 8–27)
Bilirubin Total: 0.4 mg/dL (ref 0.0–1.2)
CO2: 23 mmol/L (ref 20–29)
CREATININE: 1.04 mg/dL (ref 0.76–1.27)
Calcium: 9.6 mg/dL (ref 8.6–10.2)
Chloride: 102 mmol/L (ref 96–106)
GFR calc Af Amer: 87 mL/min/{1.73_m2} (ref >59)
GFR calc non Af Amer: 75 mL/min/{1.73_m2} (ref >59)
GLOBULIN, TOTAL: 2.3 g/dL (ref 1.5–4.5)
GLUCOSE: 90 mg/dL (ref 65–99)
Potassium: 4.7 mmol/L (ref 3.5–5.2)
SODIUM: 140 mmol/L (ref 134–144)
Total Protein: 6.6 g/dL (ref 6.0–8.5)

## 2018-08-02 LAB — CBC WITH DIFFERENTIAL/PLATELET
BASOS ABS: 0.1 10*3/uL (ref 0.0–0.2)
Basos: 1 %
EOS (ABSOLUTE): 0.3 10*3/uL (ref 0.0–0.4)
Eos: 3 %
HEMATOCRIT: 43.4 % (ref 37.5–51.0)
Hemoglobin: 15 g/dL (ref 13.0–17.7)
Immature Grans (Abs): 0.1 10*3/uL (ref 0.0–0.1)
Immature Granulocytes: 1 %
LYMPHS ABS: 2.4 10*3/uL (ref 0.7–3.1)
Lymphs: 31 %
MCH: 32.5 pg (ref 26.6–33.0)
MCHC: 34.6 g/dL (ref 31.5–35.7)
MCV: 94 fL (ref 79–97)
MONOS ABS: 0.9 10*3/uL (ref 0.1–0.9)
Monocytes: 11 %
Neutrophils Absolute: 4.1 10*3/uL (ref 1.4–7.0)
Neutrophils: 53 %
Platelets: 232 10*3/uL (ref 150–450)
RBC: 4.61 x10E6/uL (ref 4.14–5.80)
RDW: 11.9 % (ref 11.6–15.4)
WBC: 7.7 10*3/uL (ref 3.4–10.8)

## 2018-08-02 LAB — VITAMIN D 25 HYDROXY (VIT D DEFICIENCY, FRACTURES): Vit D, 25-Hydroxy: 25.6 ng/mL — ABNORMAL LOW (ref 30.0–100.0)

## 2018-08-02 LAB — LIPID PANEL W/O CHOL/HDL RATIO
CHOLESTEROL TOTAL: 191 mg/dL (ref 100–199)
HDL: 47 mg/dL (ref >39)
LDL CALC: 123 mg/dL — AB (ref 0–99)
TRIGLYCERIDES: 107 mg/dL (ref 0–149)
VLDL Cholesterol Cal: 21 mg/dL (ref 5–40)

## 2018-08-02 LAB — PSA: Prostate Specific Ag, Serum: 0.4 ng/mL (ref 0.0–4.0)

## 2018-08-02 LAB — TSH: TSH: 2.93 u[IU]/mL (ref 0.450–4.500)

## 2018-08-12 DIAGNOSIS — R0683 Snoring: Secondary | ICD-10-CM | POA: Diagnosis not present

## 2018-08-12 DIAGNOSIS — E119 Type 2 diabetes mellitus without complications: Secondary | ICD-10-CM | POA: Diagnosis not present

## 2018-08-12 DIAGNOSIS — G4733 Obstructive sleep apnea (adult) (pediatric): Secondary | ICD-10-CM | POA: Diagnosis not present

## 2018-08-12 DIAGNOSIS — E669 Obesity, unspecified: Secondary | ICD-10-CM | POA: Diagnosis not present

## 2018-09-12 DIAGNOSIS — R0683 Snoring: Secondary | ICD-10-CM | POA: Diagnosis not present

## 2018-09-12 DIAGNOSIS — E119 Type 2 diabetes mellitus without complications: Secondary | ICD-10-CM | POA: Diagnosis not present

## 2018-09-12 DIAGNOSIS — G4733 Obstructive sleep apnea (adult) (pediatric): Secondary | ICD-10-CM | POA: Diagnosis not present

## 2018-09-12 DIAGNOSIS — E669 Obesity, unspecified: Secondary | ICD-10-CM | POA: Diagnosis not present

## 2018-10-12 DIAGNOSIS — E119 Type 2 diabetes mellitus without complications: Secondary | ICD-10-CM | POA: Diagnosis not present

## 2018-10-12 DIAGNOSIS — G4733 Obstructive sleep apnea (adult) (pediatric): Secondary | ICD-10-CM | POA: Diagnosis not present

## 2018-10-12 DIAGNOSIS — R0683 Snoring: Secondary | ICD-10-CM | POA: Diagnosis not present

## 2018-10-12 DIAGNOSIS — E669 Obesity, unspecified: Secondary | ICD-10-CM | POA: Diagnosis not present

## 2018-10-24 DIAGNOSIS — G4733 Obstructive sleep apnea (adult) (pediatric): Secondary | ICD-10-CM | POA: Diagnosis not present

## 2018-11-12 DIAGNOSIS — R0683 Snoring: Secondary | ICD-10-CM | POA: Diagnosis not present

## 2018-11-12 DIAGNOSIS — E119 Type 2 diabetes mellitus without complications: Secondary | ICD-10-CM | POA: Diagnosis not present

## 2018-11-12 DIAGNOSIS — G4733 Obstructive sleep apnea (adult) (pediatric): Secondary | ICD-10-CM | POA: Diagnosis not present

## 2018-11-12 DIAGNOSIS — E669 Obesity, unspecified: Secondary | ICD-10-CM | POA: Diagnosis not present

## 2018-12-12 DIAGNOSIS — G4733 Obstructive sleep apnea (adult) (pediatric): Secondary | ICD-10-CM | POA: Diagnosis not present

## 2018-12-12 DIAGNOSIS — E119 Type 2 diabetes mellitus without complications: Secondary | ICD-10-CM | POA: Diagnosis not present

## 2018-12-12 DIAGNOSIS — E669 Obesity, unspecified: Secondary | ICD-10-CM | POA: Diagnosis not present

## 2018-12-12 DIAGNOSIS — R0683 Snoring: Secondary | ICD-10-CM | POA: Diagnosis not present

## 2019-01-22 DIAGNOSIS — R69 Illness, unspecified: Secondary | ICD-10-CM | POA: Diagnosis not present

## 2019-02-02 ENCOUNTER — Encounter: Payer: Self-pay | Admitting: Nurse Practitioner

## 2019-02-02 ENCOUNTER — Ambulatory Visit (INDEPENDENT_AMBULATORY_CARE_PROVIDER_SITE_OTHER): Payer: Medicare HMO | Admitting: Nurse Practitioner

## 2019-02-02 ENCOUNTER — Other Ambulatory Visit: Payer: Self-pay

## 2019-02-02 VITALS — Temp 98.1°F

## 2019-02-02 DIAGNOSIS — I10 Essential (primary) hypertension: Secondary | ICD-10-CM

## 2019-02-02 DIAGNOSIS — E782 Mixed hyperlipidemia: Secondary | ICD-10-CM

## 2019-02-02 NOTE — Assessment & Plan Note (Signed)
Current ASCVD 10.5%, continue focus on diet and current weight loss regimen.  Return in 6 months for physical and labs.

## 2019-02-02 NOTE — Patient Instructions (Signed)
Fat and Cholesterol Restricted Eating Plan Getting too much fat and cholesterol in your diet may cause health problems. Choosing the right foods helps keep your fat and cholesterol at normal levels. This can keep you from getting certain diseases. Your doctor may recommend an eating plan that includes:  Total fat: ______% or less of total calories a day.  Saturated fat: ______% or less of total calories a day.  Cholesterol: less than _________mg a day.  Fiber: ______g a day. What are tips for following this plan? Meal planning  At meals, divide your plate into four equal parts: ? Fill one-half of your plate with vegetables and green salads. ? Fill one-fourth of your plate with whole grains. ? Fill one-fourth of your plate with low-fat (lean) protein foods.  Eat fish that is high in omega-3 fats at least two times a week. This includes mackerel, tuna, sardines, and salmon.  Eat foods that are high in fiber, such as whole grains, beans, apples, broccoli, carrots, peas, and barley. General tips   Work with your doctor to lose weight if you need to.  Avoid: ? Foods with added sugar. ? Fried foods. ? Foods with partially hydrogenated oils.  Limit alcohol intake to no more than 1 drink a day for nonpregnant women and 2 drinks a day for men. One drink equals 12 oz of beer, 5 oz of wine, or 1 oz of hard liquor. Reading food labels  Check food labels for: ? Trans fats. ? Partially hydrogenated oils. ? Saturated fat (g) in each serving. ? Cholesterol (mg) in each serving. ? Fiber (g) in each serving.  Choose foods with healthy fats, such as: ? Monounsaturated fats. ? Polyunsaturated fats. ? Omega-3 fats.  Choose grain products that have whole grains. Look for the word "whole" as the first word in the ingredient list. Cooking  Cook foods using low-fat methods. These include baking, boiling, grilling, and broiling.  Eat more home-cooked foods. Eat at restaurants and buffets  less often.  Avoid cooking using saturated fats, such as butter, cream, palm oil, palm kernel oil, and coconut oil. Recommended foods  Fruits  All fresh, canned (in natural juice), or frozen fruits. Vegetables  Fresh or frozen vegetables (raw, steamed, roasted, or grilled). Green salads. Grains  Whole grains, such as whole wheat or whole grain breads, crackers, cereals, and pasta. Unsweetened oatmeal, bulgur, barley, quinoa, or brown rice. Corn or whole wheat flour tortillas. Meats and other protein foods  Ground beef (85% or leaner), grass-fed beef, or beef trimmed of fat. Skinless chicken or turkey. Ground chicken or turkey. Pork trimmed of fat. All fish and seafood. Egg whites. Dried beans, peas, or lentils. Unsalted nuts or seeds. Unsalted canned beans. Nut butters without added sugar or oil. Dairy  Low-fat or nonfat dairy products, such as skim or 1% milk, 2% or reduced-fat cheeses, low-fat and fat-free ricotta or cottage cheese, or plain low-fat and nonfat yogurt. Fats and oils  Tub margarine without trans fats. Light or reduced-fat mayonnaise and salad dressings. Avocado. Olive, canola, sesame, or safflower oils. The items listed above may not be a complete list of foods and beverages you can eat. Contact a dietitian for more information. Foods to avoid Fruits  Canned fruit in heavy syrup. Fruit in cream or butter sauce. Fried fruit. Vegetables  Vegetables cooked in cheese, cream, or butter sauce. Fried vegetables. Grains  White bread. White pasta. White rice. Cornbread. Bagels, pastries, and croissants. Crackers and snack foods that contain trans fat   and hydrogenated oils. Meats and other protein foods  Fatty cuts of meat. Ribs, chicken wings, bacon, sausage, bologna, salami, chitterlings, fatback, hot dogs, bratwurst, and packaged lunch meats. Liver and organ meats. Whole eggs and egg yolks. Chicken and turkey with skin. Fried meat. Dairy  Whole or 2% milk, cream,  half-and-half, and cream cheese. Whole milk cheeses. Whole-fat or sweetened yogurt. Full-fat cheeses. Nondairy creamers and whipped toppings. Processed cheese, cheese spreads, and cheese curds. Beverages  Alcohol. Sugar-sweetened drinks such as sodas, lemonade, and fruit drinks. Fats and oils  Butter, stick margarine, lard, shortening, ghee, or bacon fat. Coconut, palm kernel, and palm oils. Sweets and desserts  Corn syrup, sugars, honey, and molasses. Candy. Jam and jelly. Syrup. Sweetened cereals. Cookies, pies, cakes, donuts, muffins, and ice cream. The items listed above may not be a complete list of foods and beverages you should avoid. Contact a dietitian for more information. Summary  Choosing the right foods helps keep your fat and cholesterol at normal levels. This can keep you from getting certain diseases.  At meals, fill one-half of your plate with vegetables and green salads.  Eat high-fiber foods, like whole grains, beans, apples, carrots, peas, and barley.  Limit added sugar, saturated fats, alcohol, and fried foods. This information is not intended to replace advice given to you by your health care provider. Make sure you discuss any questions you have with your health care provider. Document Released: 11/23/2011 Document Revised: 01/25/2018 Document Reviewed: 02/08/2017 Elsevier Patient Education  2020 [employer]  

## 2019-02-02 NOTE — Assessment & Plan Note (Signed)
Has been below goal at recent appointments.  Continue focus on diet and exercise, praised for recent weight loss.  Return in 6 months for physical and labs.

## 2019-02-02 NOTE — Progress Notes (Signed)
Temp 98.1 F (36.7 C) (Oral)    Subjective:    Patient ID: Robert Rasmussen, male    DOB: 04/12/1951, 68 y.o.   MRN: [id number]  HPI: Robert Rasmussen is a 68 y.o. male  Chief Complaint  Patient presents with  . Hyperlipidemia    6 month f/up    . This visit was completed via telephone due to the restrictions of the COVID-19 pandemic. All issues as above were discussed and addressed but no physical exam was performed. If it was felt that the patient should be evaluated in the office, they were directed there. The patient verbally consented to this visit. Patient was unable to complete an audio/visual visit due to Lack of equipment. Due to the catastrophic nature of the COVID-19 pandemic, this visit was done through audio contact only. . Location of the patient: home . Location of the provider: home . Those involved with this call:  . Provider: Aura DialsJolene Cannady, DNP . CMA: Wilhemena DurieBrittany Tyron, CMA . Front Desk/Registration: Harriet PhoJoliza Johnson  . Time spent on call: 15 minutes on the phone discussing health concerns. 10 minutes total spent in review of patient's record and preparation of their chart.  . I verified patient identity using two factors (patient name and date of birth). Patient consents verbally to being seen via telemedicine visit today.    HYPERTENSION / HYPERLIPIDEMIA No current medications. February labs noted LDL 123 and TCHOL 191.  Reports he has been walking and has lost 4 inches in pant size.   Duration of hypertension: chronic BP monitoring frequency: not checking BP range:  BP medication side effects: no Duration of hyperlipidemia: chronic Aspirin: no Recent stressors: no Recurrent headaches: no Visual changes: no Palpitations: no Dyspnea: no Chest pain: no Lower extremity edema: no Dizzy/lightheaded: no  The 10-year ASCVD risk score Denman George(Goff DC Jr., et al., 2013) is: 10.5%   Values used to calculate the score:     Age: [age] years     Sex: Male     Is Non-Hispanic  African American: No     Diabetic: No     Tobacco smoker: No     Systolic Blood Pressure: 109 mmHg     Is BP treated: No     HDL Cholesterol: 47 mg/dL     Total Cholesterol: 191 mg/dL  Relevant past medical, surgical, family and social history reviewed and updated as indicated. Interim medical history since our last visit reviewed. Allergies and medications reviewed and updated.  Review of Systems  Constitutional: Negative for activity change, diaphoresis, fatigue and fever.  Respiratory: Negative for cough, chest tightness, shortness of breath and wheezing.   Cardiovascular: Negative for chest pain, palpitations and leg swelling.  Gastrointestinal: Negative for abdominal distention, abdominal pain, constipation, diarrhea, nausea and vomiting.  Neurological: Negative for dizziness, syncope, weakness, light-headedness, numbness and headaches.  Psychiatric/Behavioral: Negative.     Per HPI unless specifically indicated above     Objective:    Temp 98.1 F (36.7 C) (Oral)   Wt Readings from Last 3 Encounters:  08/01/18 (!) 358 lb (162.4 kg)  03/13/18 (!) 362 lb (164.2 kg)  07/19/17 (!) 354 lb (160.6 kg)    Physical Exam   Unable to perform, telephone visit only.  Results for orders placed or performed in visit on 08/01/18  CBC with Differential/Platelet  Result Value Ref Range   WBC 7.7 3.4 - 10.8 x10E3/uL   RBC 4.61 4.14 - 5.80 x10E6/uL   Hemoglobin 15.0 13.0 - 17.7  g/dL   Hematocrit 94.8 54.6 - 51.0 %   MCV 94 79 - 97 fL   MCH 32.5 26.6 - 33.0 pg   MCHC 34.6 31.5 - 35.7 g/dL   RDW 27.0 35.0 - 09.3 %   Platelets 232 150 - 450 x10E3/uL   Neutrophils 53 Not Estab. %   Lymphs 31 Not Estab. %   Monocytes 11 Not Estab. %   Eos 3 Not Estab. %   Basos 1 Not Estab. %   Neutrophils Absolute 4.1 1.4 - 7.0 x10E3/uL   Lymphocytes Absolute 2.4 0.7 - 3.1 x10E3/uL   Monocytes Absolute 0.9 0.1 - 0.9 x10E3/uL   EOS (ABSOLUTE) 0.3 0.0 - 0.4 x10E3/uL   Basophils Absolute 0.1 0.0 -  0.2 x10E3/uL   Immature Granulocytes 1 Not Estab. %   Immature Grans (Abs) 0.1 0.0 - 0.1 x10E3/uL  Comprehensive metabolic panel  Result Value Ref Range   Glucose 90 65 - 99 mg/dL   BUN 15 8 - 27 mg/dL   Creatinine, Ser 8.18 0.76 - 1.27 mg/dL   GFR calc non Af Amer 75 >59 mL/min/1.73   GFR calc Af Amer 87 >59 mL/min/1.73   BUN/Creatinine Ratio 14 10 - 24   Sodium 140 134 - 144 mmol/L   Potassium 4.7 3.5 - 5.2 mmol/L   Chloride 102 96 - 106 mmol/L   CO2 23 20 - 29 mmol/L   Calcium 9.6 8.6 - 10.2 mg/dL   Total Protein 6.6 6.0 - 8.5 g/dL   Albumin 4.3 3.8 - 4.8 g/dL   Globulin, Total 2.3 1.5 - 4.5 g/dL   Albumin/Globulin Ratio 1.9 1.2 - 2.2   Bilirubin Total 0.4 0.0 - 1.2 mg/dL   Alkaline Phosphatase 51 39 - 117 IU/L   AST 28 0 - 40 IU/L   ALT 28 0 - 44 IU/L  Lipid Panel w/o Chol/HDL Ratio  Result Value Ref Range   Cholesterol, Total 191 100 - 199 mg/dL   Triglycerides 299 0 - 149 mg/dL   HDL 47 >37 mg/dL   VLDL Cholesterol Cal 21 5 - 40 mg/dL   LDL Calculated 169 (H) 0 - 99 mg/dL  PSA  Result Value Ref Range   Prostate Specific Ag, Serum 0.4 0.0 - 4.0 ng/mL  TSH  Result Value Ref Range   TSH 2.930 0.450 - 4.500 uIU/mL  VITAMIN D 25 Hydroxy (Vit-D Deficiency, Fractures)  Result Value Ref Range   Vit D, 25-Hydroxy 25.6 (L) 30.0 - 100.0 ng/mL      Assessment & Plan:   Problem List Items Addressed This Visit      Cardiovascular and Mediastinum   Benign hypertension    Has been below goal at recent appointments.  Continue focus on diet and exercise, praised for recent weight loss.  Return in 6 months for physical and labs.        Other   Mixed hyperlipidemia    Current ASCVD 10.5%, continue focus on diet and current weight loss regimen.  Return in 6 months for physical and labs.         I discussed the assessment and treatment plan with the patient. The patient was provided an opportunity to ask questions and all were answered. The patient agreed with the plan and  demonstrated an understanding of the instructions.   The patient was advised to call back or seek an in-person evaluation if the symptoms worsen or if the condition fails to improve as anticipated.   I provided 15  minutes of time during this encounter.  Follow up plan: Return in about 6 months (around 08/05/2019) for Annual physical.

## 2019-02-26 ENCOUNTER — Other Ambulatory Visit: Payer: Self-pay

## 2019-02-26 ENCOUNTER — Ambulatory Visit (INDEPENDENT_AMBULATORY_CARE_PROVIDER_SITE_OTHER): Payer: Medicare HMO

## 2019-02-26 DIAGNOSIS — Z23 Encounter for immunization: Secondary | ICD-10-CM

## 2019-03-15 ENCOUNTER — Encounter: Payer: Self-pay | Admitting: Unknown Physician Specialty

## 2019-03-15 ENCOUNTER — Ambulatory Visit: Payer: Self-pay

## 2019-03-15 ENCOUNTER — Ambulatory Visit (INDEPENDENT_AMBULATORY_CARE_PROVIDER_SITE_OTHER): Payer: Medicare HMO | Admitting: Unknown Physician Specialty

## 2019-03-15 ENCOUNTER — Other Ambulatory Visit: Payer: Self-pay

## 2019-03-15 VITALS — Temp 101.4°F

## 2019-03-15 DIAGNOSIS — R5081 Fever presenting with conditions classified elsewhere: Secondary | ICD-10-CM | POA: Diagnosis not present

## 2019-03-15 NOTE — Progress Notes (Signed)
Temp (!) 101.4 F (38.6 C) (Oral)    Subjective:    Patient ID: Robert Rasmussen, male    DOB: [birth year], 68 y.o.   MRN: [id number]  HPI: Robert Rasmussen is a 68 y.o. male  Chief Complaint  Patient presents with  . Fever    pt states his temp was 98.2 at 9 AM and is now 101.4. States he has also had chills.     . This visit was completed via telephone due to the restrictions of the COVID-19 pandemic. All issues as above were discussed and addressed but no physical exam was performed. If it was felt that the patient should be evaluated in the office, they were directed there. The patient verbally consented to this visit. Patient was unable to complete an audio/visual visit due to {Blank single:19197::"Technical difficulties,Lack of internet . Location of the patient: home . Location of the provider: work . Those involved with this call:  . Provider: Kathrine Haddock, DNP . CMA: Yvonna Alanis, CMA . Front Desk/Registration: Jill Side  . Time spent on call: 10 minutes with patient face to face via video conference. More than 50% of this time was spent in counseling and coordination of care. 10 minutes total spent in review of patient's record and preparation of their chart.   Fever  This is a new problem. The current episode started today. The problem occurs constantly. The temperature was taken using an oral thermometer. Associated symptoms include muscle aches. Pertinent negatives include no congestion, coughing or sore throat. He has tried nothing for the symptoms. The treatment provided no relief.    Relevant past medical, surgical, family and social history reviewed and updated as indicated. Interim medical history since our last visit reviewed. Allergies and medications reviewed and updated.  Review of Systems  Constitutional: Positive for fever.  HENT: Negative for congestion and sore throat.   Respiratory: Negative for cough.     Per HPI unless specifically indicated  above     Objective:    Temp (!) 101.4 F (38.6 C) (Oral)   Wt Readings from Last 3 Encounters:  08/01/18 (!) 358 lb (162.4 kg)  03/13/18 (!) 362 lb (164.2 kg)  07/19/17 (!) 354 lb (160.6 kg)    Physical Exam Neurological:     Mental Status: He is alert and oriented to person, place, and time.     Results for orders placed or performed in visit on 08/01/18  CBC with Differential/Platelet  Result Value Ref Range   WBC 7.7 3.4 - 10.8 x10E3/uL   RBC 4.61 4.14 - 5.80 x10E6/uL   Hemoglobin 15.0 13.0 - 17.7 g/dL   Hematocrit 43.4 37.5 - 51.0 %   MCV 94 79 - 97 fL   MCH 32.5 26.6 - 33.0 pg   MCHC 34.6 31.5 - 35.7 g/dL   RDW 11.9 11.6 - 15.4 %   Platelets 232 150 - 450 x10E3/uL   Neutrophils 53 Not Estab. %   Lymphs 31 Not Estab. %   Monocytes 11 Not Estab. %   Eos 3 Not Estab. %   Basos 1 Not Estab. %   Neutrophils Absolute 4.1 1.4 - 7.0 x10E3/uL   Lymphocytes Absolute 2.4 0.7 - 3.1 x10E3/uL   Monocytes Absolute 0.9 0.1 - 0.9 x10E3/uL   EOS (ABSOLUTE) 0.3 0.0 - 0.4 x10E3/uL   Basophils Absolute 0.1 0.0 - 0.2 x10E3/uL   Immature Granulocytes 1 Not Estab. %   Immature Grans (Abs) 0.1 0.0 - 0.1 x10E3/uL  Comprehensive metabolic panel  Result Value Ref Range   Glucose 90 65 - 99 mg/dL   BUN 15 8 - 27 mg/dL   Creatinine, Ser 2.99 0.76 - 1.27 mg/dL   GFR calc non Af Amer 75 >59 mL/min/1.73   GFR calc Af Amer 87 >59 mL/min/1.73   BUN/Creatinine Ratio 14 10 - 24   Sodium 140 134 - 144 mmol/L   Potassium 4.7 3.5 - 5.2 mmol/L   Chloride 102 96 - 106 mmol/L   CO2 23 20 - 29 mmol/L   Calcium 9.6 8.6 - 10.2 mg/dL   Total Protein 6.6 6.0 - 8.5 g/dL   Albumin 4.3 3.8 - 4.8 g/dL   Globulin, Total 2.3 1.5 - 4.5 g/dL   Albumin/Globulin Ratio 1.9 1.2 - 2.2   Bilirubin Total 0.4 0.0 - 1.2 mg/dL   Alkaline Phosphatase 51 39 - 117 IU/L   AST 28 0 - 40 IU/L   ALT 28 0 - 44 IU/L  Lipid Panel w/o Chol/HDL Ratio  Result Value Ref Range   Cholesterol, Total 191 100 - 199 mg/dL    Triglycerides 242 0 - 149 mg/dL   HDL 47 >68 mg/dL   VLDL Cholesterol Cal 21 5 - 40 mg/dL   LDL Calculated 341 (H) 0 - 99 mg/dL  PSA  Result Value Ref Range   Prostate Specific Ag, Serum 0.4 0.0 - 4.0 ng/mL  TSH  Result Value Ref Range   TSH 2.930 0.450 - 4.500 uIU/mL  VITAMIN D 25 Hydroxy (Vit-D Deficiency, Fractures)  Result Value Ref Range   Vit D, 25-Hydroxy 25.6 (L) 30.0 - 100.0 ng/mL      Assessment & Plan:   Problem List Items Addressed This Visit    None    Visit Diagnoses    Fever in other diseases    -  Primary   Relevant Orders   Novel Coronavirus, NAA ([facility])   Temperature monitoring      Order Covid testing.  Discussed possibility of false negatives.  If continued fever and new symptoms, will need to be seen in the ER or Urgent care.     Follow up plan: Discussed with patient to self-isolate until test results.  If develops SOB head to the ER.  Will set up for home monitoring through Mychart

## 2019-03-15 NOTE — Patient Instructions (Signed)
Person Under Monitoring Name: Robert Rasmussen  [place] [address] [place] [id number]   CORONAVIRUS DISEASE 2019 (COVID-19) Guidance for Persons Under Investigation You are being tested for the virus that causes coronavirus disease 2019 (COVID-19). Public health actions are necessary to ensure protection of your health and the health of others, and to prevent further spread of infection. COVID-19 is caused by a virus that can cause symptoms, such as fever, cough, and shortness of breath. The primary transmission from person to person is by coughing or sneezing. On July 06, 2018, the World Health Organization announced a [employer] Emergency of International Concern and on July 07, 2018 the U.S. Department of Health and Human Services declared a public health emergency. If the virus that causesCOVID-19 spreads in the community, it could have severe public health consequences.  As a person under investigation for COVID-19, the [place] Department of Health and [employer], Division of [employer] advises you to adhere to the following guidance until your test results are reported to you. If your test result is positive, you will receive additional information from your provider and your local health department at that time.   Remain at home until you are cleared by your health provider or public health authorities.   Keep a log of visitors to your home using the form provided. Any visitors to your home must be aware of your isolation status.  If you plan to move to a new address or leave the county, notify the local health department in your county.  Call a doctor or seek care if you have an urgent medical need. Before seeking medical care, call ahead and get instructions from the provider before arriving at the medical office, clinic or hospital. Notify them that you are being tested for the virus that causes COVID-19 so arrangements can be made, as necessary, to prevent  transmission to others in the healthcare setting. Next, notify the local health department in your county.  If a medical emergency arises and you need to call 911, inform the first responders that you are being tested for the virus that causes COVID-19. Next, notify the local health department in your county.  Adhere to all guidance set forth by the [facility] of [facility] for [facility] of patients that is based on guidance from the Center for Disease Control and Prevention with suspected or confirmed COVID-19. It is provided with this guidance for Persons Under Investigation.  Your health and the health of our community are our top priorities. Public Health officials remain available to provide assistance and counseling to you about COVID-19 and compliance with this guidance.  Provider: ____________________________________________________________ Date: ______/_____/_________  By signing below, you acknowledge that you have read and agree to comply with this Guidance for Persons Under Investigation. ______________________________________________________________ Date: ______/_____/_________  WHO DO I CALL? You can find a list of local health departments here: http://dean.org/https://www.ncdhhs.gov/divisions/public-health/county-healthdepartments Health Department: ____________________________________________________________________ Contact Name: ________________________________________________________________________ Telephone: ___________________________________________________________________________  [facility] DHHS, Division of Public Health, Communicable Disease Branch COVID-19 Guidance for Persons Under Investigation August 12, 2018 COVID-19 COVID-19 is a respiratory infection that is caused by a virus called severe acute respiratory syndrome coronavirus 2 (SARS-CoV-2). The disease is also known as coronavirus disease or novel coronavirus. In some people, the virus may not cause any  symptoms. In others, it may cause a serious infection. The infection can get worse quickly and can lead to complications, such as:  Pneumonia, or infection of the  lungs.  Acute respiratory distress syndrome or ARDS. This is fluid build-up in the lungs.  Acute respiratory failure. This is a condition in which there is not enough oxygen passing from the lungs to the body.  Sepsis or septic shock. This is a serious bodily reaction to an infection.  Blood clotting problems.  Secondary infections due to bacteria or fungus. The virus that causes COVID-19 is contagious. This means that it can spread from person to person through droplets from coughs and sneezes (respiratory secretions). What are the causes? This illness is caused by a virus. You may catch the virus by:  Breathing in droplets from an infected person's cough or sneeze.  Touching something, like a table or a doorknob, that was exposed to the virus (contaminated) and then touching your mouth, nose, or eyes. What increases the risk? Risk for infection You are more likely to be infected with this virus if you:  Live in or travel to an area with a COVID-19 outbreak.  Come in contact with a sick person who recently traveled to an area with a COVID-19 outbreak.  Provide care for or live with a person who is infected with COVID-19. Risk for serious illness You are more likely to become seriously ill from the virus if you:  Are 66 years of age or older.  Have a long-term disease that lowers your body's ability to fight infection (immunocompromised).  Live in a nursing home or long-term care facility.  Have a long-term (chronic) disease such as: ? Chronic lung disease, including chronic obstructive pulmonary disease or asthma ? Heart disease. ? Diabetes. ? Chronic kidney disease. ? Liver disease.  Are obese. What are the signs or symptoms? Symptoms of this condition can range from mild to severe. Symptoms may appear any  time from 2 to 14 days after being exposed to the virus. They include:  A fever.  A cough.  Difficulty breathing.  Chills.  Muscle pains.  A sore throat.  Loss of taste or smell. Some people may also have stomach problems, such as nausea, vomiting, or diarrhea. Other people may not have any symptoms of COVID-19. How is this diagnosed? This condition may be diagnosed based on:  Your signs and symptoms, especially if: ? You live in an area with a COVID-19 outbreak. ? You recently traveled to or from an area where the virus is common. ? You provide care for or live with a person who was diagnosed with COVID-19.  A physical exam.  Lab tests, which may include: ? A nasal swab to take a sample of fluid from your nose. ? A throat swab to take a sample of fluid from your throat. ? A sample of mucus from your lungs (sputum). ? Blood tests.  Imaging tests, which may include, X-rays, CT scan, or ultrasound. How is this treated? At present, there is no medicine to treat COVID-19. Medicines that treat other diseases are being used on a trial basis to see if they are effective against COVID-19. Your health care provider will talk with you about ways to treat your symptoms. For most people, the infection is mild and can be managed at home with rest, fluids, and over-the-counter medicines. Treatment for a serious infection usually takes places in a hospital intensive care unit (ICU). It may include one or more of the following treatments. These treatments are given until your symptoms improve.  Receiving fluids and medicines through an IV.  Supplemental oxygen. Extra oxygen is given through a tube  in the nose, a face mask, or a hood.  Positioning you to lie on your stomach (prone position). This makes it easier for oxygen to get into the lungs.  Continuous positive airway pressure (CPAP) or bi-level positive airway pressure (BPAP) machine. This treatment uses mild air pressure to keep  the airways open. A tube that is connected to a motor delivers oxygen to the body.  Ventilator. This treatment moves air into and out of the lungs by using a tube that is placed in your windpipe.  Tracheostomy. This is a procedure to create a hole in the neck so that a breathing tube can be inserted.  Extracorporeal membrane oxygenation (ECMO). This procedure gives the lungs a chance to recover by taking over the functions of the heart and lungs. It supplies oxygen to the body and removes carbon dioxide. Follow these instructions at home: Lifestyle  If you are sick, stay home except to get medical care. Your health care provider will tell you how long to stay home. Call your health care provider before you go for medical care.  Rest at home as told by your health care provider.  Do not use any products that contain nicotine or tobacco, such as cigarettes, e-cigarettes, and chewing tobacco. If you need help quitting, ask your health care provider.  Return to your normal activities as told by your health care provider. Ask your health care provider what activities are safe for you. General instructions  Take over-the-counter and prescription medicines only as told by your health care provider.  Drink enough fluid to keep your urine pale yellow.  Keep all follow-up visits as told by your health care provider. This is important. How is this prevented?  There is no vaccine to help prevent COVID-19 infection. However, there are steps you can take to protect yourself and others from this virus. To protect yourself:   Do not travel to areas where COVID-19 is a risk. The areas where COVID-19 is reported change often. To identify high-risk areas and travel restrictions, check the CDC travel website: StageSync.si  If you live in, or must travel to, an area where COVID-19 is a risk, take precautions to avoid infection. ? Stay away from people who are sick. ? Wash your hands often  with soap and water for 20 seconds. If soap and water are not available, use an alcohol-based hand sanitizer. ? Avoid touching your mouth, face, eyes, or nose. ? Avoid going out in public, follow guidance from your state and local health authorities. ? If you must go out in public, wear a cloth face covering or face mask. ? Disinfect objects and surfaces that are frequently touched every day. This may include:  Counters and tables.  Doorknobs and light switches.  Sinks and faucets.  Electronics, such as phones, remote controls, keyboards, computers, and tablets. To protect others: If you have symptoms of COVID-19, take steps to prevent the virus from spreading to others.  If you think you have a COVID-19 infection, contact your health care provider right away. Tell your health care team that you think you may have a COVID-19 infection.  Stay home. Leave your house only to seek medical care. Do not use public transport.  Do not travel while you are sick.  Wash your hands often with soap and water for 20 seconds. If soap and water are not available, use alcohol-based hand sanitizer.  Stay away from other members of your household. Let healthy household members care for children and  pets, if possible. If you have to care for children or pets, wash your hands often and wear a mask. If possible, stay in your own room, separate from others. Use a different bathroom.  Make sure that all people in your household wash their hands well and often.  Cough or sneeze into a tissue or your sleeve or elbow. Do not cough or sneeze into your hand or into the air.  Wear a cloth face covering or face mask. Where to find more information  Centers for Disease Control and Prevention: PurpleGadgets.be  World Health Organization: https://www.castaneda.info/ Contact a health care provider if:  You live in or have traveled to an area where COVID-19 is a risk and you  have symptoms of the infection.  You have had contact with someone who has COVID-19 and you have symptoms of the infection. Get help right away if:  You have trouble breathing.  You have pain or pressure in your chest.  You have confusion.  You have bluish lips and fingernails.  You have difficulty waking from sleep.  You have symptoms that get worse. These symptoms may represent a serious problem that is an emergency. Do not wait to see if the symptoms will go away. Get medical help right away. Call your local emergency services (911 in the [place] Do not drive yourself to the hospital. Let the emergency medical personnel know if you think you have COVID-19. Summary  COVID-19 is a respiratory infection that is caused by a virus. It is also known as coronavirus disease or novel coronavirus. It can cause serious infections, such as pneumonia, acute respiratory distress syndrome, acute respiratory failure, or sepsis.  The virus that causes COVID-19 is contagious. This means that it can spread from person to person through droplets from coughs and sneezes.  You are more likely to develop a serious illness if you are 49 years of age or older, have a weak immunity, live in a nursing home, or have chronic disease.  There is no medicine to treat COVID-19. Your health care provider will talk with you about ways to treat your symptoms.  Take steps to protect yourself and others from infection. Wash your hands often and disinfect objects and surfaces that are frequently touched every day. Stay away from people who are sick and wear a mask if you are sick. This information is not intended to replace advice given to you by your health care provider. Make sure you discuss any questions you have with your health care provider. Document Released: 06/29/2018 Document Revised: 10/19/2018 Document Reviewed: 06/29/2018 Elsevier Patient Education  2020 [employer].

## 2019-03-15 NOTE — Telephone Encounter (Signed)
Pt. Reports this afternoon he developed a temp. Of 100.7 and having chills. Concerned about COVID 19. Per Daleen Snook, pt. Scheduled for virtual visit today at 4:00. Pt. Verbalizes understanding. Answer Assessment - Initial Assessment Questions 1. TEMPERATURE: "What is the most recent temperature?"  "How was it measured?"      100.7 2. ONSET: "When did the fever start?"      This morning 3. SYMPTOMS: "Do you have any other symptoms besides the fever?"  (e.g., colds, headache, sore throat, earache, cough, rash, diarrhea, vomiting, abdominal pain)     Chills 4. CAUSE: If there are no symptoms, ask: "What do you think is causing the fever?"      Unsure 5. CONTACTS: "Does anyone else in the family have an infection?"     No 6. TREATMENT: "What have you done so far to treat this fever?" (e.g., medications)     Nothing 7. IMMUNOCOMPROMISE: "Do you have of the following: diabetes, HIV positive, splenectomy, cancer chemotherapy, chronic steroid treatment, transplant patient, etc."     No 8. PREGNANCY: "Is there any chance you are pregnant?" "When was your last menstrual period?"     n/a 9. TRAVEL: "Have you traveled out of the country in the last month?" (e.g., travel history, exposures)     No  Protocols used: FEVER-A-AH

## 2019-03-16 ENCOUNTER — Encounter: Payer: Self-pay | Admitting: Family Medicine

## 2019-03-16 ENCOUNTER — Ambulatory Visit: Payer: Medicare HMO | Admitting: Family Medicine

## 2019-03-16 ENCOUNTER — Telehealth: Payer: Self-pay | Admitting: Nurse Practitioner

## 2019-03-16 ENCOUNTER — Other Ambulatory Visit: Payer: Self-pay

## 2019-03-16 ENCOUNTER — Telehealth (INDEPENDENT_AMBULATORY_CARE_PROVIDER_SITE_OTHER): Payer: Medicare HMO | Admitting: Family Medicine

## 2019-03-16 VITALS — Temp 100.7°F

## 2019-03-16 DIAGNOSIS — Z20822 Contact with and (suspected) exposure to covid-19: Secondary | ICD-10-CM

## 2019-03-16 DIAGNOSIS — L03115 Cellulitis of right lower limb: Secondary | ICD-10-CM

## 2019-03-16 DIAGNOSIS — R509 Fever, unspecified: Secondary | ICD-10-CM

## 2019-03-16 DIAGNOSIS — L0390 Cellulitis, unspecified: Secondary | ICD-10-CM

## 2019-03-16 DIAGNOSIS — Z20828 Contact with and (suspected) exposure to other viral communicable diseases: Secondary | ICD-10-CM | POA: Diagnosis not present

## 2019-03-16 MED ORDER — SULFAMETHOXAZOLE-TRIMETHOPRIM 800-160 MG PO TABS: 1.0000 | ORAL_TABLET | Freq: Two times a day (BID) | ORAL | 0 refills | Status: DC

## 2019-03-16 NOTE — Progress Notes (Signed)
Temp (!) 100.7 F (38.2 C) (Oral)    Subjective:    Patient ID: Robert Rasmussen, male    DOB: 04-20-1951, 68 y.o.   MRN: [id number]  HPI: Robert Rasmussen is a 68 y.o. male  Chief Complaint  Patient presents with  . Cellulitis    . This visit was completed via WebEx due to the restrictions of the COVID-19 pandemic. All issues as above were discussed and addressed. Physical exam was done as above through visual confirmation on WebEx. If it was felt that the patient should be evaluated in the office, they were directed there. The patient verbally consented to this visit. . Location of the patient: home . Location of the provider: work . Those involved with this call:  . Provider: Roosvelt Maser, PA-C . CMA: Wilhemena Durie, CMA . Front Desk/Registration: Adela Ports  . Time spent on call: 15 minutes with patient face to face via video conference. More than 50% of this time was spent in counseling and coordination of care. 5 minutes total spent in review of patient's record and preparation of their chart. I verified patient identity using two factors (patient name and date of birth). Patient consents verbally to being seen via telemedicine visit today.   Started having warmth, redness, and swelling yesterday on right leg. Had a scrape in that area from a few days ago. States last night the redness extended just above ankle and now just below knee. Having fevers around 100.7 - 102. Taking ibuprofen regularly for this. Was seen yesterday for fever and went for COVID testing this morning - results pending.   Relevant past medical, surgical, family and social history reviewed and updated as indicated. Interim medical history since our last visit reviewed. Allergies and medications reviewed and updated.  Review of Systems  Per HPI unless specifically indicated above     Objective:    Temp (!) 100.7 F (38.2 C) (Oral)   Wt Readings from Last 3 Encounters:  08/01/18 (!) 358 lb (162.4  kg)  03/13/18 (!) 362 lb (164.2 kg)  07/19/17 (!) 354 lb (160.6 kg)    Physical Exam Vitals signs and nursing note reviewed.  Constitutional:      General: He is not in acute distress.    Appearance: Normal appearance.  HENT:     Head: Atraumatic.     Right Ear: External ear normal.     Left Ear: External ear normal.     Nose: Nose normal. No congestion.     Mouth/Throat:     Mouth: Mucous membranes are moist.     Pharynx: Oropharynx is clear.  Eyes:     Extraocular Movements: Extraocular movements intact.     Conjunctiva/sclera: Conjunctivae normal.  Neck:     Musculoskeletal: Normal range of motion.  Pulmonary:     Effort: Pulmonary effort is normal. No respiratory distress.  Musculoskeletal: Normal range of motion.        General: Swelling present.  Skin:    General: Skin is dry.     Findings: Erythema (anterior right lower leg erythematous, edematous with abrasion present. No active drainage obvious ) present. No rash.  Neurological:     Mental Status: He is oriented to person, place, and time.  Psychiatric:        Mood and Affect: Mood normal.        Thought Content: Thought content normal.        Judgment: Judgment normal.     Results for orders  placed or performed in visit on 03/16/19  Novel Coronavirus, NAA ([facility])   Specimen: Nasopharyngeal(NP) swabs in vial transport medium   NASOPHARYNGE  TESTING  Result Value Ref Range   SARS-CoV-2, NAA Not Detected Not Detected      Assessment & Plan:   Problem List Items Addressed This Visit    None    Visit Diagnoses    Cellulitis of right lower extremity    -  Primary   Concern with rapid onset fever and spread of erythema. Tx with bactrim, epsom salt soaks, topical antibiotics. Strict ER precautions if not improving    Fever, unspecified fever cause       COVID test pending, concern for rapidly progressing leg infection. ER precautions given if not improving significantly in next 1-2 days       Follow up  plan: Return if symptoms worsen or fail to improve.

## 2019-03-16 NOTE — Progress Notes (Signed)
Based on what you shared with me, I feel your condition warrants further evaluation and I recommend that you be seen for a face to face office visit.  NOTE: If you entered your credit card information for this eVisit, you will not be charged. You may see a "hold" on your card for the $35 but that hold will drop off and you will not have a charge processed.  If you are having a true medical emergency please call 911.     For an urgent face to face visit, [facility] has four urgent care centers for your convenience:   . [facility]    [phone]                  Get Driving Directions  [address], [place] [address] . 10 am to 8 pm Monday-Friday . 12 pm to 8 pm Saturday-Sunday   . [facility] [place]                  Get Driving Directions  [address], [place], [place] [address] . 8 am to 8 pm Monday-Friday . 9 am to 6 pm Saturday . 11 am to 6 pm Sunday   . [facility] [place]                  Get Driving Directions   [address] [place], [place] [address] . 8 am to 8 pm Monday-Friday . 8 am to 4 pm Saturday-Sunday    . [facility] [place]                    Get Driving Directions  [phone]  [address], [place], [place] [address]  . Monday-Friday, 12 PM to 6 PM    Your e-visit answers were reviewed by a board certified advanced clinical practitioner to complete your personal care plan.  Thank you for using e-Visits.  I have spent approximately 5 minutes reviewing and documenting in the patient's chart.

## 2019-03-17 LAB — NOVEL CORONAVIRUS, NAA: SARS-CoV-2, NAA: NOT DETECTED

## 2019-03-22 ENCOUNTER — Telehealth: Payer: Self-pay | Admitting: Nurse Practitioner

## 2019-03-22 MED ORDER — SULFAMETHOXAZOLE-TRIMETHOPRIM 800-160 MG PO TABS: 1.0000 | ORAL_TABLET | Freq: Two times a day (BID) | ORAL | 0 refills | Status: DC

## 2019-03-22 NOTE — Telephone Encounter (Signed)
I will refill but if not better he needs to be seen

## 2019-03-22 NOTE — Telephone Encounter (Signed)
Called patient. Left VM for patient to return call to the office. 

## 2019-03-22 NOTE — Telephone Encounter (Signed)
Routing to provider  

## 2019-03-22 NOTE — Telephone Encounter (Signed)
Patient states that he had a fever that broke on Friday. Patient has been on the Bactrim and today is his last day. Patient states that it's not completely gone and would like another round of medication to take with him as he heads out of town on Saturday. Please advise

## 2019-03-22 NOTE — Telephone Encounter (Signed)
Patient notified

## 2019-03-22 NOTE — Telephone Encounter (Signed)
Pt called stating the cellulitus in his leg has not gone away completely. Pt states he had a tempature spike. Pt is requesting to have a refill on the sulfamethoxazole-trimethoprim (BACTRIM DS) 800-160 MG tablet. Please advise as pt is going out of town on Saturday morning.    [place], [place]  [place] [address]  Phone: [phone] Fax: [phone]  Not a 24 hour pharmacy; exact hours not known.

## 2019-05-11 DIAGNOSIS — G4733 Obstructive sleep apnea (adult) (pediatric): Secondary | ICD-10-CM | POA: Diagnosis not present

## 2019-06-14 IMAGING — DX DG CHEST 2V
4 series · 4 of 4 positions shown · non-contrast
Comparison: None in PACs

CLINICAL DATA: One month of nonproductive cough worrisome for
bronchitis or pneumonia. Mild exertional shortness of breath.
History of childhood asthma. Nonsmoker.

EXAM:
CHEST  2 VIEW

[chest pa (1 of 2)]
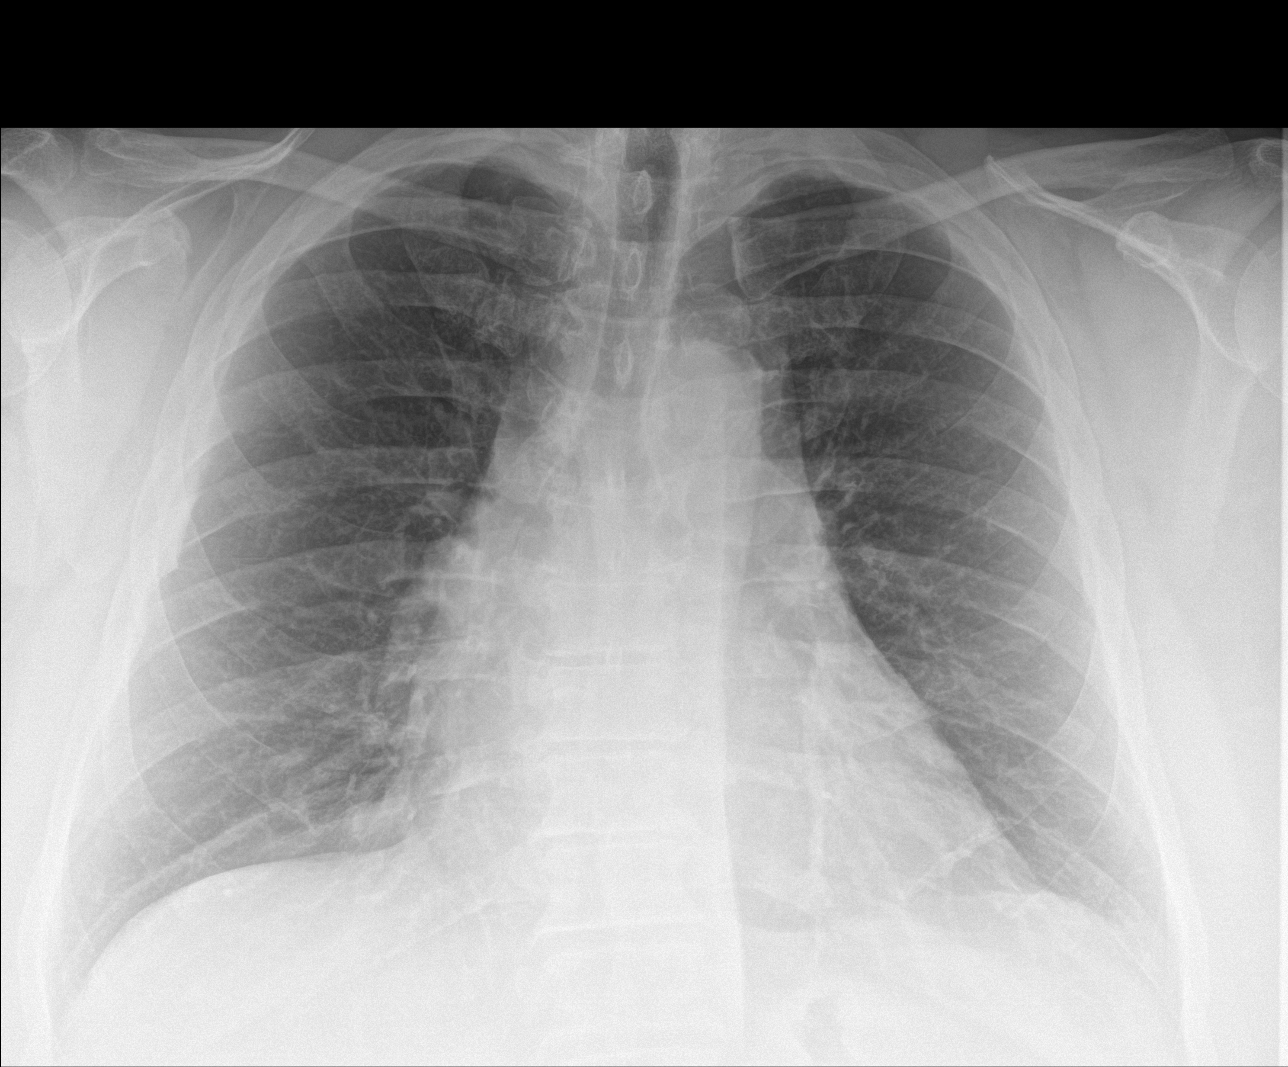

[chest lat (1 of 2)]
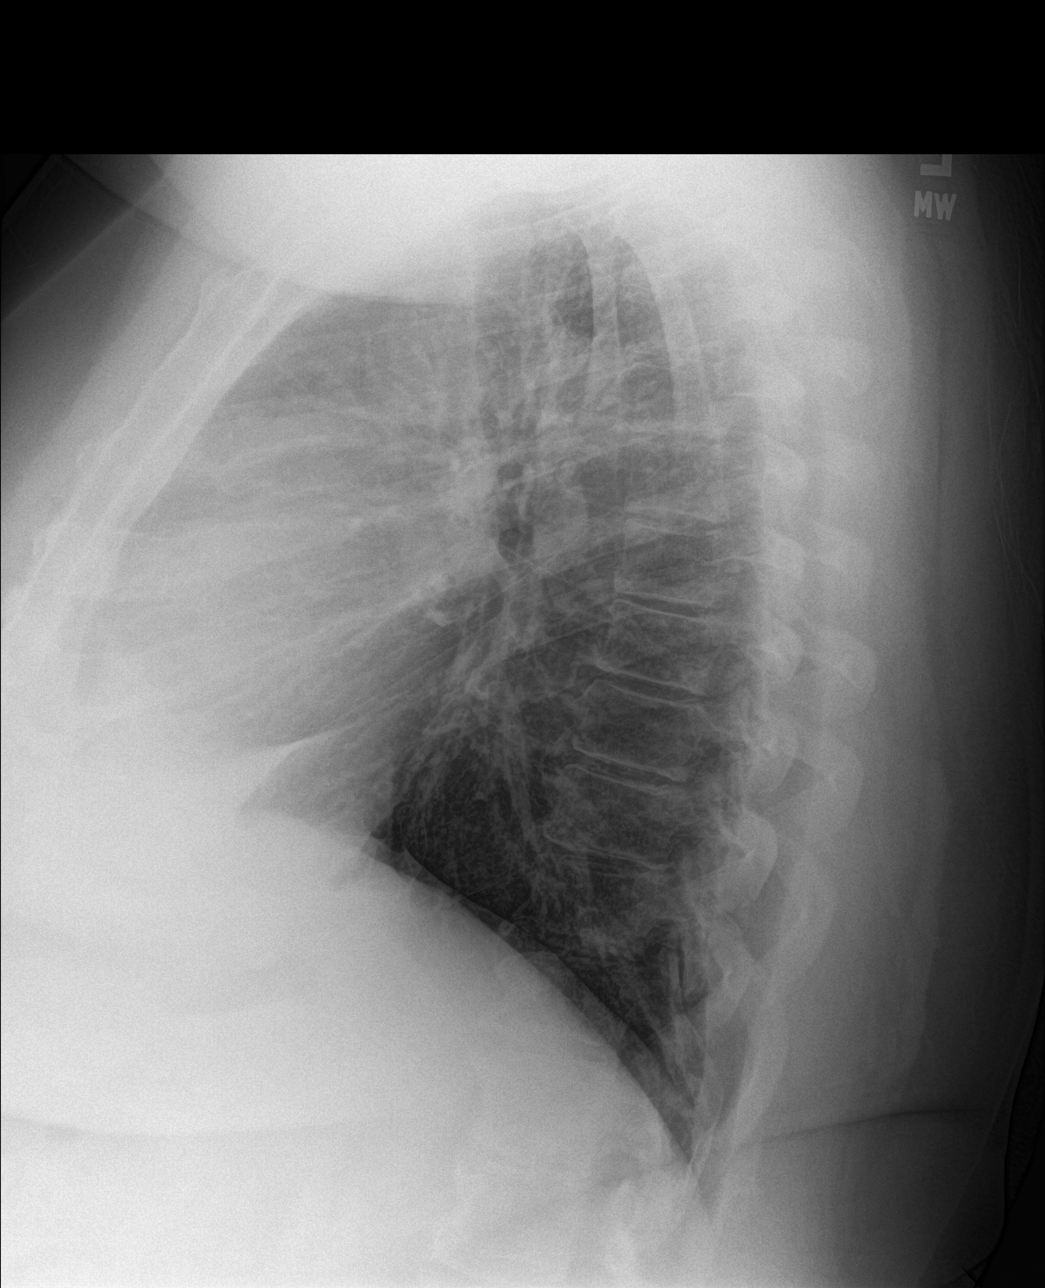

[chest pa (2 of 2)]
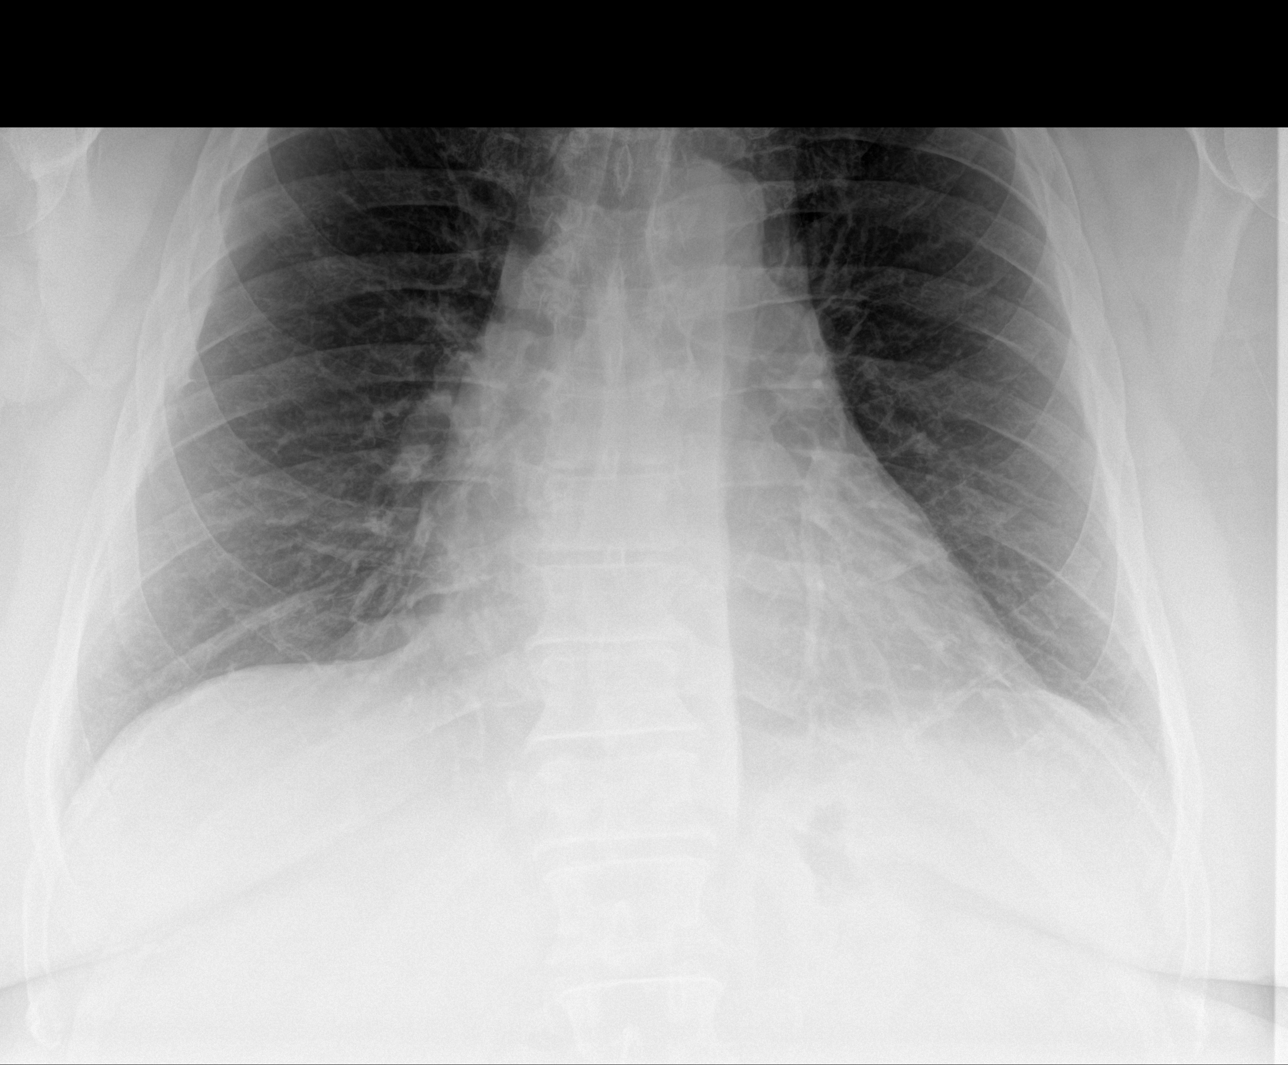

[chest lat (2 of 2)]
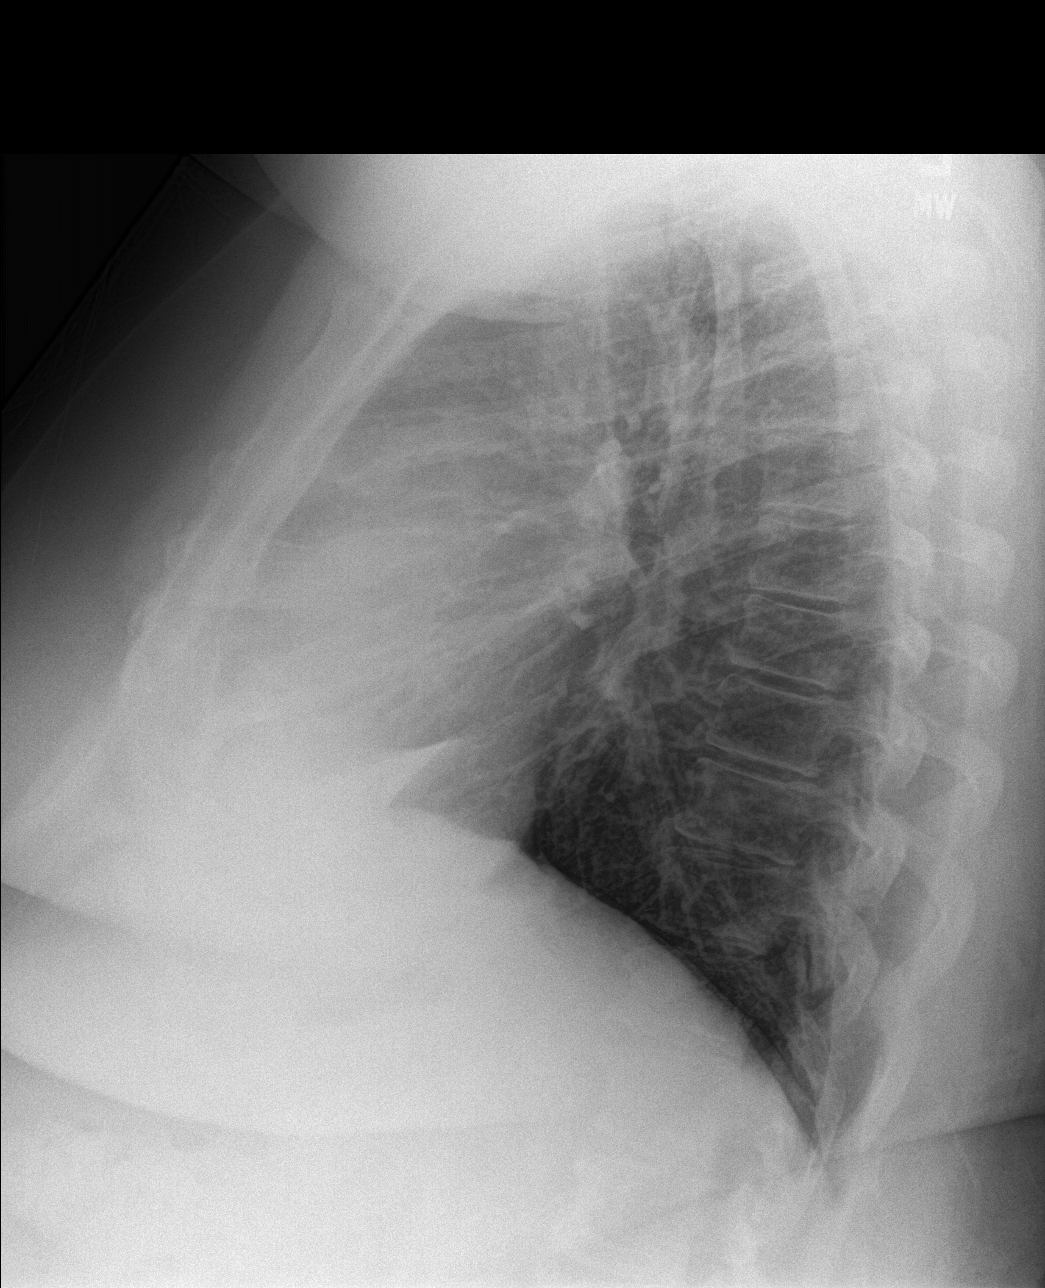

[4 of 4 positions shown; findings below may reference images not displayed]

FINDINGS: The lungs are well-expanded. There is no focal infiltrate. The
interstitial markings are prominent especially in the lower lobes.
There is pleural thickening along the lateral thoracic cage
bilaterally. There is no pleural effusion. The heart is top-normal
in size. The pulmonary vascularity is normal. The bony thorax
exhibits no acute abnormality.
IMPRESSION: There is no pneumonia nor other acute cardiopulmonary abnormality.
Mild interstitial prominence likely reflects bronchitic changes
either acute or chronic.

## 2019-07-16 ENCOUNTER — Ambulatory Visit: Payer: Medicare HMO | Attending: Internal Medicine

## 2019-07-16 DIAGNOSIS — Z23 Encounter for immunization: Secondary | ICD-10-CM | POA: Insufficient documentation

## 2019-07-16 NOTE — Progress Notes (Signed)
   Covid-19 Vaccination Clinic  Name:  MANDRELL VANGILDER    MRN: [id number] DOB: [birth year]  07/16/2019  Mr. Woodrome was observed post Covid-19 immunization for 15 minutes without incidence. He was provided with Vaccine Information Sheet and instruction to access the V-Safe system.   Mr. Davenport was instructed to call 911 with any severe reactions post vaccine: Marland Kitchen Difficulty breathing  . Swelling of your face and throat  . A fast heartbeat  . A bad rash all over your body  . Dizziness and weakness    Immunizations Administered    Name Date Dose VIS Date Route   Pfizer COVID-19 Vaccine 07/16/2019 11:41 AM 0.3 mL 05/18/2019 Intramuscular   Manufacturer: [employer], [employer]   Lot: [id number]   NDC: [address]-2

## 2019-07-30 DIAGNOSIS — R69 Illness, unspecified: Secondary | ICD-10-CM | POA: Diagnosis not present

## 2019-08-04 ENCOUNTER — Ambulatory Visit: Payer: Self-pay

## 2019-08-07 ENCOUNTER — Encounter: Payer: Self-pay | Admitting: Nurse Practitioner

## 2019-08-09 ENCOUNTER — Ambulatory Visit: Payer: Medicare HMO | Attending: Internal Medicine

## 2019-08-09 DIAGNOSIS — Z23 Encounter for immunization: Secondary | ICD-10-CM

## 2019-08-09 NOTE — Progress Notes (Signed)
   Covid-19 Vaccination [facility]  Name:  Robert Rasmussen    MRN: [id number] DOB: [birth year]  08/09/2019  [name] was observed post Covid-19 immunization for 15 minutes without incident. He was provided with Vaccine Information Sheet and instruction to access the V-Safe system.   [name] was instructed to call 911 with any severe reactions post vaccine: Marland Kitchen Difficulty breathing  . Swelling of face and throat  . A fast heartbeat  . A bad rash all over body  . Dizziness and weakness   Immunizations Administered    Name Date Dose VIS Date Route   Pfizer COVID-19 Vaccine 08/09/2019  5:31 PM 0.3 mL 05/18/2019 Intramuscular   Manufacturer: [employer], [employer]   Lot: [id number]   NDC: [address]-6

## 2019-08-20 ENCOUNTER — Ambulatory Visit (INDEPENDENT_AMBULATORY_CARE_PROVIDER_SITE_OTHER): Payer: Medicare HMO | Admitting: Nurse Practitioner

## 2019-08-20 ENCOUNTER — Encounter: Payer: Self-pay | Admitting: Nurse Practitioner

## 2019-08-20 ENCOUNTER — Other Ambulatory Visit: Payer: Self-pay

## 2019-08-20 ENCOUNTER — Ambulatory Visit: Payer: Medicare HMO

## 2019-08-20 VITALS — BP 126/79 | HR 61 | Temp 98.7°F | Ht 66.5 in | Wt 348.0 lb

## 2019-08-20 DIAGNOSIS — Z0000 Encounter for general adult medical examination without abnormal findings: Secondary | ICD-10-CM

## 2019-08-20 DIAGNOSIS — E559 Vitamin D deficiency, unspecified: Secondary | ICD-10-CM

## 2019-08-20 DIAGNOSIS — E668 Other obesity: Secondary | ICD-10-CM | POA: Diagnosis not present

## 2019-08-20 DIAGNOSIS — I10 Essential (primary) hypertension: Secondary | ICD-10-CM

## 2019-08-20 DIAGNOSIS — E782 Mixed hyperlipidemia: Secondary | ICD-10-CM | POA: Diagnosis not present

## 2019-08-20 DIAGNOSIS — G4733 Obstructive sleep apnea (adult) (pediatric): Secondary | ICD-10-CM

## 2019-08-20 DIAGNOSIS — N400 Enlarged prostate without lower urinary tract symptoms: Secondary | ICD-10-CM | POA: Diagnosis not present

## 2019-08-20 NOTE — Assessment & Plan Note (Addendum)
Ongoing.  Current ASCVD 24.2%, continue focus on diet and current weight loss regimen.  If ongoing elevation these labs will further discuss recommendation for statin therapy.  At this time he wishes to continue focus on diet.  Not fasting today.

## 2019-08-20 NOTE — Assessment & Plan Note (Addendum)
Recommend continued focus on healthy diet choices and regular physical activity (30 minutes 5 days a week).  He has started walking again.

## 2019-08-20 NOTE — Assessment & Plan Note (Addendum)
Has been below goal at recent appointments.  Continue focus on diet and exercise, praised for recent weight loss.  Recommend he check BP at home at least a few days a week.  Return in 6 months for follow-up.  Labs today.

## 2019-08-20 NOTE — Progress Notes (Signed)
BP 126/79   Pulse 61   Temp 98.7 F (37.1 C) (Oral)   Ht 5' 6.5" (1.689 m)   Wt (!) 348 lb (157.9 kg)   SpO2 97%   BMI 55.33 kg/m    Subjective:    Patient ID: Robert Rasmussen, male    DOB: 1951-01-12, 69 y.o.   MRN: [id number]  HPI: Robert Rasmussen is a 69 y.o. male presenting on 08/20/2019 for comprehensive medical examination. Current medical complaints include:none  He currently lives with: self Interim Problems from his last visit: no   Has diagnosis of HTN, not checking BP at home, no current medications.  Does have Vitamin D deficiency, takes daily supplement.  Does have CPAP at home and uses 100% of the time.  No current cholesterol medications. The 10-year ASCVD risk score [name], et al., 2013) is: 24.2%   Values used to calculate the score:     Age: 6 years     Sex: Male     Is Non-Hispanic African American: No     Diabetic: Yes     Tobacco smoker: No     Systolic Blood Pressure: 126 mmHg     Is BP treated: No     HDL Cholesterol: 47 mg/dL     Total Cholesterol: 191 mg/dL   Functional Status Survey: Is the patient deaf or have difficulty hearing?: No Does the patient have difficulty seeing, even when wearing glasses/contacts?: No Does the patient have difficulty concentrating, remembering, or making decisions?: No Does the patient have difficulty walking or climbing stairs?: No Does the patient have difficulty dressing or bathing?: No Does the patient have difficulty doing errands alone such as visiting a doctor's office or shopping?: No  FALL RISK: Fall Risk  08/20/2019 08/01/2018 07/19/2017 07/14/2016  Falls in the past year? 0 0 No No  Number falls in past yr: 0 0 - -  Injury with Fall? 0 0 - -    Depression Screen Depression screen [facility] 2/9 08/20/2019 08/01/2018 08/01/2018 07/19/2017 07/14/2016  Decreased Interest 0 0 0 0 0  Down, Depressed, Hopeless 0 0 0 0 0  PHQ - 2 Score 0 0 0 0 0  Altered sleeping 0 0 0 0 -  Tired, decreased energy 0 0 0 0 -  Change  in appetite 2 0 0 0 -  Feeling bad or failure about yourself  0 0 0 0 -  Trouble concentrating 0 0 0 0 -  Moving slowly or fidgety/restless 0 0 0 0 -  Suicidal thoughts 0 0 0 0 -  PHQ-9 Score 2 0 0 0 -  Difficult doing work/chores - - Not difficult at all - -    Advanced Directives <no information>  Past Medical History:  Past Medical History:  Diagnosis Date  . Allergy    seasonal  . Amebic dysentery   . Asthma   . Cellulitis   . GERD (gastroesophageal reflux disease)     Surgical History:  Past Surgical History:  Procedure Laterality Date  . amebic dysentery    . TONSILLECTOMY      Medications:  Current Outpatient Medications on File Prior to Visit  Medication Sig  . Cholecalciferol (VITAMIN D3) 25 MCG (1000 UT) CAPS Take 1 capsule by mouth daily.  . Multiple Vitamins-Minerals (ONE-A-DAY MENS 50+ ADVANTAGE) TABS Take 1 tablet by mouth daily.   No current facility-administered medications on file prior to visit.    Allergies:  No Known Allergies  Social  History:  Social History   Socioeconomic History  . Marital status: Divorced    Spouse name: Not on file  . Number of children: Not on file  . Years of education: Not on file  . Highest education level: Not on file  Occupational History  . Not on file  Tobacco Use  . Smoking status: Never Smoker  . Smokeless tobacco: Never Used  Substance and Sexual Activity  . Alcohol use: Yes    Alcohol/week: 0.0 standard drinks    Comment: pt states he has a drink about every 4 weeks  . Drug use: No  . Sexual activity: Yes  Other Topics Concern  . Not on file  Social History Narrative  . Not on file   Social Determinants of Health   Financial Resource Strain:   . Difficulty of Paying Living Expenses:   Food Insecurity:   . Worried About Programme researcher, broadcasting/film/videounning Out of Food in the Last Year:   . Baristaan Out of Food in the Last Year:   Transportation Needs:   . Freight forwarderLack of Transportation (Medical):   Marland Kitchen. Lack of Transportation  (Non-Medical):   Physical Activity:   . Days of Exercise per Week:   . Minutes of Exercise per Session:   Stress:   . Feeling of Stress :   Social Connections:   . Frequency of Communication with Friends and Family:   . Frequency of Social Gatherings with Friends and Family:   . Attends Religious Services:   . Active Member of Clubs or Organizations:   . Attends BankerClub or Organization Meetings:   Marland Kitchen. Marital Status:   Intimate Partner Violence:   . Fear of Current or Ex-Partner:   . Emotionally Abused:   Marland Kitchen. Physically Abused:   . Sexually Abused:    Social History   Tobacco Use  Smoking Status Never Smoker  Smokeless Tobacco Never Used   Social History   Substance and Sexual Activity  Alcohol Use Yes  . Alcohol/week: 0.0 standard drinks   Comment: pt states he has a drink about every 4 weeks    Family History:  Family History  Problem Relation Age of Onset  . Cancer Mother        breast  . Cancer Father        skin  . Cancer Sister        breast  . Heart disease Paternal Grandmother        heart attack    Past medical history, surgical history, medications, allergies, family history and social history reviewed with patient today and changes made to appropriate areas of the chart.   Review of Systems - negative All other ROS negative except what is listed above and in the HPI.      Objective:    BP 126/79   Pulse 61   Temp 98.7 F (37.1 C) (Oral)   Ht 5' 6.5" (1.689 m)   Wt (!) 348 lb (157.9 kg)   SpO2 97%   BMI 55.33 kg/m   Wt Readings from Last 3 Encounters:  08/20/19 (!) 348 lb (157.9 kg)  08/01/18 (!) 358 lb (162.4 kg)  03/13/18 (!) 362 lb (164.2 kg)    Physical Exam Vitals and nursing note reviewed.  Constitutional:      General: He is awake. He is not in acute distress.    Appearance: He is well-developed. He is morbidly obese. He is not ill-appearing.  HENT:     Head: Normocephalic and atraumatic.  Right Ear: Hearing, tympanic membrane,  ear canal and external ear normal. No drainage.     Left Ear: Hearing, tympanic membrane, ear canal and external ear normal. No drainage.     Nose: Nose normal.     Mouth/Throat:     Pharynx: Uvula midline.  Eyes:     General: Lids are normal.        Right eye: No discharge.        Left eye: No discharge.     Extraocular Movements: Extraocular movements intact.     Conjunctiva/sclera: Conjunctivae normal.     Pupils: Pupils are equal, round, and reactive to light.     Visual Fields: Right eye visual fields normal and left eye visual fields normal.  Neck:     Thyroid: No thyromegaly.     Vascular: No carotid bruit or JVD.     Trachea: Trachea normal.  Cardiovascular:     Rate and Rhythm: Normal rate and regular rhythm.     Heart sounds: Normal heart sounds, S1 normal and S2 normal. No murmur. No gallop.   Pulmonary:     Effort: Pulmonary effort is normal. No accessory muscle usage or respiratory distress.     Breath sounds: Normal breath sounds.  Abdominal:     General: Bowel sounds are normal.     Palpations: Abdomen is soft. There is no hepatomegaly or splenomegaly.     Tenderness: There is no abdominal tenderness.  Musculoskeletal:        General: Normal range of motion.     Cervical back: Normal range of motion and neck supple.     Right lower leg: No edema.     Left lower leg: No edema.  Lymphadenopathy:     Head:     Right side of head: No submental, submandibular, tonsillar, preauricular or posterior auricular adenopathy.     Left side of head: No submental, submandibular, tonsillar, preauricular or posterior auricular adenopathy.     Cervical: No cervical adenopathy.  Skin:    General: Skin is warm and dry.     Capillary Refill: Capillary refill takes less than 2 seconds.     Findings: No rash.  Neurological:     Mental Status: He is alert and oriented to person, place, and time.     Cranial Nerves: Cranial nerves are intact.     Gait: Gait is intact.     Deep  Tendon Reflexes: Reflexes are normal and symmetric.     Reflex Scores:      Brachioradialis reflexes are 2+ on the right side and 2+ on the left side.      Patellar reflexes are 2+ on the right side and 2+ on the left side. Psychiatric:        Attention and Perception: Attention normal.        Mood and Affect: Mood normal.        Speech: Speech normal.        Behavior: Behavior normal. Behavior is cooperative.        Thought Content: Thought content normal.        Cognition and Memory: Cognition normal.        Judgment: Judgment normal.     Results for orders placed or performed in visit on 03/16/19  Novel Coronavirus, NAA ([facility])   Specimen: Nasopharyngeal(NP) swabs in vial transport medium   NASOPHARYNGE  TESTING  Result Value Ref Range   SARS-CoV-2, NAA Not Detected Not Detected  Assessment & Plan:   Problem List Items Addressed This Visit      Cardiovascular and Mediastinum   Benign hypertension    Has been below goal at recent appointments.  Continue focus on diet and exercise, praised for recent weight loss.  Recommend he check BP at home at least a few days a week.  Return in 6 months for follow-up.  Labs today.      Relevant Orders   CBC with Differential/Platelet   TSH     Respiratory   Sleep apnea    Chronic, uses CPAP 100% of the time.  Praised for consistent use.        Other   Extreme obesity    Recommend continued focus on healthy diet choices and regular physical activity (30 minutes 5 days a week).  He has started walking again.       Relevant Orders   TSH   Vitamin D deficiency    Chronic, stable.  Continue daily supplement and recheck Vit D level today.      Relevant Orders   VITAMIN D 25 Hydroxy (Vit-D Deficiency, Fractures)   Mixed hyperlipidemia    Ongoing.  Current ASCVD 24.2%, continue focus on diet and current weight loss regimen.  If ongoing elevation these labs will further discuss recommendation for statin therapy.  At this  time he wishes to continue focus on diet.  Not fasting today.      Relevant Orders   Comprehensive metabolic panel   Lipid Panel w/o Chol/HDL Ratio    Other Visit Diagnoses    Annual physical exam    -  Primary   Annual labs today CBC, CMP, TSH, lipid   Relevant Orders   CBC with Differential/Platelet   PSA      Discussed aspirin prophylaxis for myocardial infarction prevention and decision was it was not indicated  LABORATORY TESTING:  Health maintenance labs ordered today as discussed above.   The natural history of prostate cancer and ongoing controversy regarding screening and potential treatment outcomes of prostate cancer has been discussed with the patient. The meaning of a false positive PSA and a false negative PSA has been discussed. He indicates understanding of the limitations of this screening test and wishes to proceed with screening PSA testing.   IMMUNIZATIONS:   - Tdap: Tetanus vaccination status reviewed: last tetanus booster within 10 years. - Influenza: Up to date - Pneumovax: Up to date - Prevnar: Up to date - Zostavax vaccine: Up to date  SCREENING: - Colonoscopy: Up to date -- Cologuard due in August  Discussed with patient purpose of the colonoscopy is to detect colon cancer at curable precancerous or early stages   - AAA Screening: Not applicable  -Hearing Test: Not applicable  -Spirometry: Not applicable   PATIENT COUNSELING:    Sexuality: Discussed sexually transmitted diseases, partner selection, use of condoms, avoidance of unintended pregnancy  and contraceptive alternatives.   Advised to avoid cigarette smoking.  I discussed with the patient that most people either abstain from alcohol or drink within safe limits (<=14/week and <=4 drinks/occasion for males, <=7/weeks and <= 3 drinks/occasion for females) and that the risk for alcohol disorders and other health effects rises proportionally with the number of drinks per week and how often a  drinker exceeds daily limits.  Discussed cessation/primary prevention of drug use and availability of treatment for abuse.   Diet: Encouraged to adjust caloric intake to maintain  or achieve ideal body weight, to reduce intake  of dietary saturated fat and total fat, to limit sodium intake by avoiding high sodium foods and not adding table salt, and to maintain adequate dietary potassium and calcium preferably from fresh fruits, vegetables, and low-fat dairy products.    stressed the importance of regular exercise  Injury prevention: Discussed safety belts, safety helmets, smoke detector, smoking near bedding or upholstery.   Dental health: Discussed importance of regular tooth brushing, flossing, and dental visits.   Follow up plan: NEXT PREVENTATIVE PHYSICAL DUE IN 1 YEAR. Return in about 6 months (around 02/20/2020) for HTN/HLD.

## 2019-08-20 NOTE — Assessment & Plan Note (Signed)
Chronic, uses CPAP 100% of the time.  Praised for consistent use.

## 2019-08-20 NOTE — Patient Instructions (Signed)
DASH Eating Plan DASH stands for "Dietary Approaches to Stop Hypertension." The DASH eating plan is a healthy eating plan that has been shown to reduce high blood pressure (hypertension). It may also reduce your risk for type 2 diabetes, heart disease, and stroke. The DASH eating plan may also help with weight loss. What are tips for following this plan?  General guidelines  Avoid eating more than 2,300 mg (milligrams) of salt (sodium) a day. If you have hypertension, you may need to reduce your sodium intake to 1,500 mg a day.  Limit alcohol intake to no more than 1 drink a day for nonpregnant women and 2 drinks a day for men. One drink equals 12 oz of beer, 5 oz of wine, or 1 oz of hard liquor.  Work with your health care provider to maintain a healthy body weight or to lose weight. Ask what an ideal weight is for you.  Get at least 30 minutes of exercise that causes your heart to beat faster (aerobic exercise) most days of the week. Activities may include walking, swimming, or biking.  Work with your health care provider or diet and nutrition specialist (dietitian) to adjust your eating plan to your individual calorie needs. Reading food labels   Check food labels for the amount of sodium per serving. Choose foods with less than 5 percent of the Daily Value of sodium. Generally, foods with less than 300 mg of sodium per serving fit into this eating plan.  To find whole grains, look for the word "whole" as the first word in the ingredient list. Shopping  Buy products labeled as "low-sodium" or "no salt added."  Buy fresh foods. Avoid canned foods and premade or frozen meals. Cooking  Avoid adding salt when cooking. Use salt-free seasonings or herbs instead of table salt or sea salt. Check with your health care provider or pharmacist before using salt substitutes.  Do not fry foods. Cook foods using healthy methods such as baking, boiling, grilling, and broiling instead.  Cook with  heart-healthy oils, such as olive, canola, soybean, or sunflower oil. Meal planning  Eat a balanced diet that includes: ? 5 or more servings of fruits and vegetables each day. At each meal, try to fill half of your plate with fruits and vegetables. ? Up to 6-8 servings of whole grains each day. ? Less than 6 oz of lean meat, poultry, or fish each day. A 3-oz serving of meat is about the same size as a deck of cards. One egg equals 1 oz. ? 2 servings of low-fat dairy each day. ? A serving of nuts, seeds, or beans 5 times each week. ? Heart-healthy fats. Healthy fats called Omega-3 fatty acids are found in foods such as flaxseeds and coldwater fish, like sardines, salmon, and mackerel.  Limit how much you eat of the following: ? Canned or prepackaged foods. ? Food that is high in trans fat, such as fried foods. ? Food that is high in saturated fat, such as fatty meat. ? Sweets, desserts, sugary drinks, and other foods with added sugar. ? Full-fat dairy products.  Do not salt foods before eating.  Try to eat at least 2 vegetarian meals each week.  Eat more home-cooked food and less restaurant, buffet, and fast food.  When eating at a restaurant, ask that your food be prepared with less salt or no salt, if possible. What foods are recommended? The items listed may not be a complete list. Talk with your dietitian about   what dietary choices are best for you. Grains Whole-grain or whole-wheat bread. Whole-grain or whole-wheat pasta. Brown rice. Oatmeal. Quinoa. Bulgur. Whole-grain and low-sodium cereals. Pita bread. Low-fat, low-sodium crackers. Whole-wheat flour tortillas. Vegetables Fresh or frozen vegetables (raw, steamed, roasted, or grilled). Low-sodium or reduced-sodium tomato and vegetable juice. Low-sodium or reduced-sodium tomato sauce and tomato paste. Low-sodium or reduced-sodium canned vegetables. Fruits All fresh, dried, or frozen fruit. Canned fruit in natural juice (without  added sugar). Meat and other protein foods Skinless chicken or turkey. Ground chicken or turkey. Pork with fat trimmed off. Fish and seafood. Egg whites. Dried beans, peas, or lentils. Unsalted nuts, nut butters, and seeds. Unsalted canned beans. Lean cuts of beef with fat trimmed off. Low-sodium, lean deli meat. Dairy Low-fat (1%) or fat-free (skim) milk. Fat-free, low-fat, or reduced-fat cheeses. Nonfat, low-sodium ricotta or cottage cheese. Low-fat or nonfat yogurt. Low-fat, low-sodium cheese. Fats and oils Soft margarine without trans fats. Vegetable oil. Low-fat, reduced-fat, or light mayonnaise and salad dressings (reduced-sodium). Canola, safflower, olive, soybean, and sunflower oils. Avocado. Seasoning and other foods Herbs. Spices. Seasoning mixes without salt. Unsalted popcorn and pretzels. Fat-free sweets. What foods are not recommended? The items listed may not be a complete list. Talk with your dietitian about what dietary choices are best for you. Grains Baked goods made with fat, such as croissants, muffins, or some breads. Dry pasta or rice meal packs. Vegetables Creamed or fried vegetables. Vegetables in a cheese sauce. Regular canned vegetables (not low-sodium or reduced-sodium). Regular canned tomato sauce and paste (not low-sodium or reduced-sodium). Regular tomato and vegetable juice (not low-sodium or reduced-sodium). Pickles. Olives. Fruits Canned fruit in a light or heavy syrup. Fried fruit. Fruit in cream or butter sauce. Meat and other protein foods Fatty cuts of meat. Ribs. Fried meat. Bacon. Sausage. Bologna and other processed lunch meats. Salami. Fatback. Hotdogs. Bratwurst. Salted nuts and seeds. Canned beans with added salt. Canned or smoked fish. Whole eggs or egg yolks. Chicken or turkey with skin. Dairy Whole or 2% milk, cream, and half-and-half. Whole or full-fat cream cheese. Whole-fat or sweetened yogurt. Full-fat cheese. Nondairy creamers. Whipped toppings.  Processed cheese and cheese spreads. Fats and oils Butter. Stick margarine. Lard. Shortening. Ghee. Bacon fat. Tropical oils, such as coconut, palm kernel, or palm oil. Seasoning and other foods Salted popcorn and pretzels. Onion salt, garlic salt, seasoned salt, table salt, and sea salt. Worcestershire sauce. Tartar sauce. Barbecue sauce. Teriyaki sauce. Soy sauce, including reduced-sodium. Steak sauce. Canned and packaged gravies. Fish sauce. Oyster sauce. Cocktail sauce. Horseradish that you find on the shelf. Ketchup. Mustard. Meat flavorings and tenderizers. Bouillon cubes. Hot sauce and Tabasco sauce. Premade or packaged marinades. Premade or packaged taco seasonings. Relishes. Regular salad dressings. Where to find more information:  National Heart, Lung, and Blood Institute: www.nhlbi.nih.gov  American Heart Association: www.heart.org Summary  The DASH eating plan is a healthy eating plan that has been shown to reduce high blood pressure (hypertension). It may also reduce your risk for type 2 diabetes, heart disease, and stroke.  With the DASH eating plan, you should limit salt (sodium) intake to 2,300 mg a day. If you have hypertension, you may need to reduce your sodium intake to 1,500 mg a day.  When on the DASH eating plan, aim to eat more fresh fruits and vegetables, whole grains, lean proteins, low-fat dairy, and heart-healthy fats.  Work with your health care provider or diet and nutrition specialist (dietitian) to adjust your eating plan to your   individual calorie needs. This information is not intended to replace advice given to you by your health care provider. Make sure you discuss any questions you have with your health care provider. Document Revised: 05/06/2017 Document Reviewed: 05/17/2016 Elsevier Patient Education  2020 Elsevier Inc.  

## 2019-08-20 NOTE — Assessment & Plan Note (Signed)
Chronic, stable.  Continue daily supplement and recheck Vit D level today.

## 2019-08-21 LAB — CBC WITH DIFFERENTIAL/PLATELET
Basophils Absolute: 0.1 10*3/uL (ref 0.0–0.2)
Basos: 1 %
EOS (ABSOLUTE): 0.2 10*3/uL (ref 0.0–0.4)
Eos: 3 %
Hematocrit: 42.7 % (ref 37.5–51.0)
Hemoglobin: 14.5 g/dL (ref 13.0–17.7)
Immature Grans (Abs): 0 10*3/uL (ref 0.0–0.1)
Immature Granulocytes: 1 %
Lymphocytes Absolute: 2.2 10*3/uL (ref 0.7–3.1)
Lymphs: 29 %
MCH: 32.7 pg (ref 26.6–33.0)
MCHC: 34 g/dL (ref 31.5–35.7)
MCV: 96 fL (ref 79–97)
Monocytes Absolute: 0.8 10*3/uL (ref 0.1–0.9)
Monocytes: 10 %
Neutrophils Absolute: 4.3 10*3/uL (ref 1.4–7.0)
Neutrophils: 56 %
Platelets: 214 10*3/uL (ref 150–450)
RBC: 4.43 x10E6/uL (ref 4.14–5.80)
RDW: 11.8 % (ref 11.6–15.4)
WBC: 7.7 10*3/uL (ref 3.4–10.8)

## 2019-08-21 LAB — COMPREHENSIVE METABOLIC PANEL
ALT: 27 IU/L (ref 0–44)
AST: 25 IU/L (ref 0–40)
Albumin/Globulin Ratio: 2.2 (ref 1.2–2.2)
Albumin: 4.3 g/dL (ref 3.8–4.8)
Alkaline Phosphatase: 52 IU/L (ref 39–117)
BUN/Creatinine Ratio: 13 (ref 10–24)
BUN: 16 mg/dL (ref 8–27)
Bilirubin Total: 0.4 mg/dL (ref 0.0–1.2)
CO2: 28 mmol/L (ref 20–29)
Calcium: 9.3 mg/dL (ref 8.6–10.2)
Chloride: 107 mmol/L — ABNORMAL HIGH (ref 96–106)
Creatinine, Ser: 1.19 mg/dL (ref 0.76–1.27)
GFR calc Af Amer: 73 mL/min/{1.73_m2} (ref >59)
GFR calc non Af Amer: 63 mL/min/{1.73_m2} (ref >59)
Globulin, Total: 2 g/dL (ref 1.5–4.5)
Glucose: 98 mg/dL (ref 65–99)
Potassium: 4.9 mmol/L (ref 3.5–5.2)
Sodium: 145 mmol/L — ABNORMAL HIGH (ref 134–144)
Total Protein: 6.3 g/dL (ref 6.0–8.5)

## 2019-08-21 LAB — TSH: TSH: 2 u[IU]/mL (ref 0.450–4.500)

## 2019-08-21 LAB — PSA: Prostate Specific Ag, Serum: 0.3 ng/mL (ref 0.0–4.0)

## 2019-08-21 LAB — LIPID PANEL W/O CHOL/HDL RATIO
Cholesterol, Total: 173 mg/dL (ref 100–199)
HDL: 40 mg/dL (ref >39)
LDL Chol Calc (NIH): 107 mg/dL — ABNORMAL HIGH (ref 0–99)
Triglycerides: 144 mg/dL (ref 0–149)
VLDL Cholesterol Cal: 26 mg/dL (ref 5–40)

## 2019-08-21 LAB — VITAMIN D 25 HYDROXY (VIT D DEFICIENCY, FRACTURES): Vit D, 25-Hydroxy: 34.2 ng/mL (ref 30.0–100.0)

## 2019-08-21 NOTE — Progress Notes (Signed)
Contacted via MyChart Good evening Mr. Wesche. Your labs have returned.  Overall they look great with exception of: - Sodium level mildly elevated, increase your water intake daily and we will recheck this next visit.  Decrease sodium intake too.  - Prostate and thyroid are normal range, as is Vitamin D (this is good)  - Cholesterol levels does show elevation in LDL (bad cholesterol) putting your 10 year heart risk event score at 24.8%.  I do recommend starting a statin.  We could start on low dose of one to see if tolerate and then work our way up on dose as needed to help lower levels.  If this is okay with you please let me know.  If you wish to continue focus on diet, I do recommend returning to see me in a few months so we can recheck.  My concern is these levels do put you at risk for heart events or stroke, statins can help reduce this risk.  Let me know your thoughts. Have a good evening.  The 10-year ASCVD risk score Denman George DC Montez Hageman., et al., 2013) is: 24.8%   Values used to calculate the score:     Age: 32 years     Sex: Male     Is Non-Hispanic African American: No     Diabetic: Yes     Tobacco smoker: No     Systolic Blood Pressure: 126 mmHg     Is BP treated: No     HDL Cholesterol: 40 mg/dL     Total Cholesterol: 173 mg/dL

## 2019-08-28 ENCOUNTER — Encounter: Payer: Self-pay | Admitting: Nurse Practitioner

## 2019-08-28 ENCOUNTER — Telehealth (INDEPENDENT_AMBULATORY_CARE_PROVIDER_SITE_OTHER): Payer: Medicare HMO | Admitting: Nurse Practitioner

## 2019-08-28 VITALS — Temp 98.9°F | Ht 66.5 in | Wt 346.0 lb

## 2019-08-28 DIAGNOSIS — E668 Other obesity: Secondary | ICD-10-CM | POA: Diagnosis not present

## 2019-08-28 DIAGNOSIS — E782 Mixed hyperlipidemia: Secondary | ICD-10-CM

## 2019-08-28 DIAGNOSIS — E870 Hyperosmolality and hypernatremia: Secondary | ICD-10-CM | POA: Insufficient documentation

## 2019-08-28 MED ORDER — ATORVASTATIN CALCIUM 10 MG PO TABS: 10.0000 mg | ORAL_TABLET | Freq: Every day | ORAL | 3 refills | Status: DC

## 2019-08-28 NOTE — Progress Notes (Signed)
Temp 98.9 F (37.2 C) (Oral)   Ht 5' 6.5" (1.689 m)   Wt (!) 346 lb (156.9 kg)   SpO2 97%   BMI 55.01 kg/m    Subjective:    Patient ID: Robert Rasmussen, male    DOB: Jul 24, 1950, 69 y.o.   MRN: [id number]  HPI: Robert Rasmussen is a 69 y.o. male  Chief Complaint  Patient presents with  . Follow-up    duscuss lab results    . This visit was completed via MyChart due to the restrictions of the COVID-19 pandemic. All issues as above were discussed and addressed. Physical exam was done as above through visual confirmation on MyChart. If it was felt that the patient should be evaluated in the office, they were directed there. The patient verbally consented to this visit. . Location of the patient: home . Location of the provider: work . Those involved with this call:  . Provider: Aura Dials, DNP . CMA: Wilhemena Durie, CMA . Front Desk/Registration: Adela Ports  . Time spent on call: 15 minutes with patient face to face via video conference. More than 50% of this time was spent in counseling and coordination of care. 10 minutes total spent in review of patient's record and preparation of their chart.  . I verified patient identity using two factors (patient name and date of birth). Patient consents verbally to being seen via telemedicine visit today.    HYPERLIPIDEMIA Not currently on medication.  He is interested in starting statin, discussed at length benefits vs risks of statin.  Discussed possible side effects that can present.  At length education on goals with statin use and stroke prevention.  Reviewed recent labs with mild elevation in NA+ and chloride noted as well, he had ate a couple hours prior to visit. Past cholesterol meds: none Supplements: none Aspirin:  no The 10-year ASCVD risk score Robert Rasmussen DC Jr., et al., 2013) is: 24.8%   Values used to calculate the score:     Age: 12 years     Sex: Male     Is Non-Hispanic African American: No     Diabetic: Yes      Tobacco smoker: No     Systolic Blood Pressure: 126 mmHg     Is BP treated: No     HDL Cholesterol: 40 mg/dL     Total Cholesterol: 173 mg/dL Chest pain:  no Coronary artery disease:  no Family history CAD:  no Family history early CAD:  no  Relevant past medical, surgical, family and social history reviewed and updated as indicated. Interim medical history since our last visit reviewed. Allergies and medications reviewed and updated.  Review of Systems  Constitutional: Negative for activity change, diaphoresis, fatigue and fever.  Respiratory: Negative for cough, chest tightness, shortness of breath and wheezing.   Cardiovascular: Negative for chest pain, palpitations and leg swelling.  Gastrointestinal: Negative.   Neurological: Negative.   Psychiatric/Behavioral: Negative.     Per HPI unless specifically indicated above     Objective:    Temp 98.9 F (37.2 C) (Oral)   Ht 5' 6.5" (1.689 m)   Wt (!) 346 lb (156.9 kg)   SpO2 97%   BMI 55.01 kg/m   Wt Readings from Last 3 Encounters:  08/28/19 (!) 346 lb (156.9 kg)  08/20/19 (!) 348 lb (157.9 kg)  08/01/18 (!) 358 lb (162.4 kg)    Physical Exam Vitals and nursing note reviewed.  Constitutional:  General: He is awake. He is not in acute distress.    Appearance: He is well-developed. He is morbidly obese. He is not ill-appearing.  HENT:     Head: Normocephalic.     Right Ear: Hearing normal. No drainage.     Left Ear: Hearing normal. No drainage.  Eyes:     General: Lids are normal.        Right eye: No discharge.        Left eye: No discharge.     Conjunctiva/sclera: Conjunctivae normal.  Pulmonary:     Effort: Pulmonary effort is normal. No accessory muscle usage or respiratory distress.  Musculoskeletal:     Cervical back: Normal range of motion.  Neurological:     Mental Status: He is alert and oriented to person, place, and time.  Psychiatric:        Mood and Affect: Mood normal.        Behavior:  Behavior normal. Behavior is cooperative.        Thought Content: Thought content normal.        Judgment: Judgment normal.     Results for orders placed or performed in visit on 08/20/19  CBC with Differential/Platelet  Result Value Ref Range   WBC 7.7 3.4 - 10.8 x10E3/uL   RBC 4.43 4.14 - 5.80 x10E6/uL   Hemoglobin 14.5 13.0 - 17.7 g/dL   Hematocrit 76.2 83.1 - 51.0 %   MCV 96 79 - 97 fL   MCH 32.7 26.6 - 33.0 pg   MCHC 34.0 31.5 - 35.7 g/dL   RDW 51.7 61.6 - 07.3 %   Platelets 214 150 - 450 x10E3/uL   Neutrophils 56 Not Estab. %   Lymphs 29 Not Estab. %   Monocytes 10 Not Estab. %   Eos 3 Not Estab. %   Basos 1 Not Estab. %   Neutrophils Absolute 4.3 1.4 - 7.0 x10E3/uL   Lymphocytes Absolute 2.2 0.7 - 3.1 x10E3/uL   Monocytes Absolute 0.8 0.1 - 0.9 x10E3/uL   EOS (ABSOLUTE) 0.2 0.0 - 0.4 x10E3/uL   Basophils Absolute 0.1 0.0 - 0.2 x10E3/uL   Immature Granulocytes 1 Not Estab. %   Immature Grans (Abs) 0.0 0.0 - 0.1 x10E3/uL  Comprehensive metabolic panel  Result Value Ref Range   Glucose 98 65 - 99 mg/dL   BUN 16 8 - 27 mg/dL   Creatinine, Ser 7.10 0.76 - 1.27 mg/dL   GFR calc non Af Amer 63 >59 mL/min/1.73   GFR calc Af Amer 73 >59 mL/min/1.73   BUN/Creatinine Ratio 13 10 - 24   Sodium 145 (H) 134 - 144 mmol/L   Potassium 4.9 3.5 - 5.2 mmol/L   Chloride 107 (H) 96 - 106 mmol/L   CO2 28 20 - 29 mmol/L   Calcium 9.3 8.6 - 10.2 mg/dL   Total Protein 6.3 6.0 - 8.5 g/dL   Albumin 4.3 3.8 - 4.8 g/dL   Globulin, Total 2.0 1.5 - 4.5 g/dL   Albumin/Globulin Ratio 2.2 1.2 - 2.2   Bilirubin Total 0.4 0.0 - 1.2 mg/dL   Alkaline Phosphatase 52 39 - 117 IU/L   AST 25 0 - 40 IU/L   ALT 27 0 - 44 IU/L  Lipid Panel w/o Chol/HDL Ratio  Result Value Ref Range   Cholesterol, Total 173 100 - 199 mg/dL   Triglycerides 626 0 - 149 mg/dL   HDL 40 >94 mg/dL   VLDL Cholesterol Cal 26 5 - 40 mg/dL  LDL Chol Calc (NIH) 107 (H) 0 - 99 mg/dL  TSH  Result Value Ref Range   TSH 2.000  0.450 - 4.500 uIU/mL  PSA  Result Value Ref Range   Prostate Specific Ag, Serum 0.3 0.0 - 4.0 ng/mL  VITAMIN D 25 Hydroxy (Vit-D Deficiency, Fractures)  Result Value Ref Range   Vit D, 25-Hydroxy 34.2 30.0 - 100.0 ng/mL      Assessment & Plan:   Problem List Items Addressed This Visit      Other   Extreme obesity    Recommend continued focus on healthy diet choices and regular physical activity (30 minutes 5 days a week).  He has started walking again, continue this exercise regimen.       Mixed hyperlipidemia - Primary    Chronic, ongoing.  ASCVD 24.8%.  At length education on statin use and high cholesterol with patient.  He would like to start statin, educated on side effects to notify provider of.  Script for Atorvastatin 10 MG QHS orally, sent to pharmacy.  Will plan on follow-up in 6 weeks in office for lab check and medication talk.      Relevant Medications   atorvastatin (LIPITOR) 10 MG tablet   Hypernatremia    Mild at 145 on recent labs with slightly elevated chloride.  Asymptomatic.  Recommend increase fluid intake at home and reduce sodium intake.  Will plan on recheck of these labs at next visit in 6 weeks.           I discussed the assessment and treatment plan with the patient. The patient was provided an opportunity to ask questions and all were answered. The patient agreed with the plan and demonstrated an understanding of the instructions.   The patient was advised to call back or seek an in-person evaluation if the symptoms worsen or if the condition fails to improve as anticipated.   I provided 15+ minutes of time during this encounter.  Follow up plan: Return in about 6 weeks (around 10/09/2019) for HLD and lab check in office.

## 2019-08-28 NOTE — Patient Instructions (Signed)
Fat and Cholesterol Restricted Eating Plan Getting too much fat and cholesterol in your diet may cause health problems. Choosing the right foods helps keep your fat and cholesterol at normal levels. This can keep you from getting certain diseases. Your doctor may recommend an eating plan that includes:  Total fat: ______% or less of total calories a day.  Saturated fat: ______% or less of total calories a day.  Cholesterol: less than _________mg a day.  Fiber: ______g a day. What are tips for following this plan? Meal planning  At meals, divide your plate into four equal parts: ? Fill one-half of your plate with vegetables and green salads. ? Fill one-fourth of your plate with whole grains. ? Fill one-fourth of your plate with low-fat (lean) protein foods.  Eat fish that is high in omega-3 fats at least two times a week. This includes mackerel, tuna, sardines, and salmon.  Eat foods that are high in fiber, such as whole grains, beans, apples, broccoli, carrots, peas, and barley. General tips   Work with your doctor to lose weight if you need to.  Avoid: ? Foods with added sugar. ? Fried foods. ? Foods with partially hydrogenated oils.  Limit alcohol intake to no more than 1 drink a day for nonpregnant women and 2 drinks a day for men. One drink equals 12 oz of beer, 5 oz of wine, or 1 oz of hard liquor. Reading food labels  Check food labels for: ? Trans fats. ? Partially hydrogenated oils. ? Saturated fat (g) in each serving. ? Cholesterol (mg) in each serving. ? Fiber (g) in each serving.  Choose foods with healthy fats, such as: ? Monounsaturated fats. ? Polyunsaturated fats. ? Omega-3 fats.  Choose grain products that have whole grains. Look for the word "whole" as the first word in the ingredient list. Cooking  Cook foods using low-fat methods. These include baking, boiling, grilling, and broiling.  Eat more home-cooked foods. Eat at restaurants and buffets  less often.  Avoid cooking using saturated fats, such as butter, cream, palm oil, palm kernel oil, and coconut oil. Recommended foods  Fruits  All fresh, canned (in natural juice), or frozen fruits. Vegetables  Fresh or frozen vegetables (raw, steamed, roasted, or grilled). Green salads. Grains  Whole grains, such as whole wheat or whole grain breads, crackers, cereals, and pasta. Unsweetened oatmeal, bulgur, barley, quinoa, or brown rice. Corn or whole wheat flour tortillas. Meats and other protein foods  Ground beef (85% or leaner), grass-fed beef, or beef trimmed of fat. Skinless chicken or turkey. Ground chicken or turkey. Pork trimmed of fat. All fish and seafood. Egg whites. Dried beans, peas, or lentils. Unsalted nuts or seeds. Unsalted canned beans. Nut butters without added sugar or oil. Dairy  Low-fat or nonfat dairy products, such as skim or 1% milk, 2% or reduced-fat cheeses, low-fat and fat-free ricotta or cottage cheese, or plain low-fat and nonfat yogurt. Fats and oils  Tub margarine without trans fats. Light or reduced-fat mayonnaise and salad dressings. Avocado. Olive, canola, sesame, or safflower oils. The items listed above may not be a complete list of foods and beverages you can eat. Contact a dietitian for more information. Foods to avoid Fruits  Canned fruit in heavy syrup. Fruit in cream or butter sauce. Fried fruit. Vegetables  Vegetables cooked in cheese, cream, or butter sauce. Fried vegetables. Grains  White bread. White pasta. White rice. Cornbread. Bagels, pastries, and croissants. Crackers and snack foods that contain trans fat   and hydrogenated oils. Meats and other protein foods  Fatty cuts of meat. Ribs, chicken wings, bacon, sausage, bologna, salami, chitterlings, fatback, hot dogs, bratwurst, and packaged lunch meats. Liver and organ meats. Whole eggs and egg yolks. Chicken and turkey with skin. Fried meat. Dairy  Whole or 2% milk, cream,  half-and-half, and cream cheese. Whole milk cheeses. Whole-fat or sweetened yogurt. Full-fat cheeses. Nondairy creamers and whipped toppings. Processed cheese, cheese spreads, and cheese curds. Beverages  Alcohol. Sugar-sweetened drinks such as sodas, lemonade, and fruit drinks. Fats and oils  Butter, stick margarine, lard, shortening, ghee, or bacon fat. Coconut, palm kernel, and palm oils. Sweets and desserts  Corn syrup, sugars, honey, and molasses. Candy. Jam and jelly. Syrup. Sweetened cereals. Cookies, pies, cakes, donuts, muffins, and ice cream. The items listed above may not be a complete list of foods and beverages you should avoid. Contact a dietitian for more information. Summary  Choosing the right foods helps keep your fat and cholesterol at normal levels. This can keep you from getting certain diseases.  At meals, fill one-half of your plate with vegetables and green salads.  Eat high-fiber foods, like whole grains, beans, apples, carrots, peas, and barley.  Limit added sugar, saturated fats, alcohol, and fried foods. This information is not intended to replace advice given to you by your health care provider. Make sure you discuss any questions you have with your health care provider. Document Revised: 01/25/2018 Document Reviewed: 02/08/2017 Elsevier Patient Education  2020 [employer]  

## 2019-08-28 NOTE — Assessment & Plan Note (Signed)
Chronic, ongoing.  ASCVD 24.8%.  At length education on statin use and high cholesterol with patient.  He would like to start statin, educated on side effects to notify provider of.  Script for Atorvastatin 10 MG QHS orally, sent to pharmacy.  Will plan on follow-up in 6 weeks in office for lab check and medication talk.

## 2019-08-28 NOTE — Assessment & Plan Note (Signed)
Recommend continued focus on healthy diet choices and regular physical activity (30 minutes 5 days a week).  He has started walking again, continue this exercise regimen.

## 2019-08-28 NOTE — Assessment & Plan Note (Signed)
Mild at 145 on recent labs with slightly elevated chloride.  Asymptomatic.  Recommend increase fluid intake at home and reduce sodium intake.  Will plan on recheck of these labs at next visit in 6 weeks.

## 2019-09-10 ENCOUNTER — Ambulatory Visit (INDEPENDENT_AMBULATORY_CARE_PROVIDER_SITE_OTHER): Payer: Medicare HMO

## 2019-09-10 DIAGNOSIS — Z0000 Encounter for general adult medical examination without abnormal findings: Secondary | ICD-10-CM | POA: Diagnosis not present

## 2019-09-10 NOTE — Patient Instructions (Signed)
Robert Rasmussen , Thank you for taking time to come for your Medicare Wellness Visit. I appreciate your ongoing commitment to your health goals. Please review the following plan we discussed and let me know if I can assist you in the future.   Screening recommendations/referrals: Colonoscopy: cologuard completed 2018, due 01/2020 Recommended yearly ophthalmology/optometry visit for glaucoma screening and checkup Recommended yearly dental visit for hygiene and checkup  Vaccinations: Influenza vaccine: up to date  Pneumococcal vaccine: completed series  Tdap vaccine: up to date Shingles vaccine: shingrix eligible. Check with pharmacy for coverage and vaccine information    Covid-19: completed   Advanced directives: Please bring a copy of your health care power of attorney and living will to the office at your convenience.  Conditions/risks identified: none   Next appointment: Follow up in one year for your annual wellness visit   Preventive Care 65 Years and Older, Male Preventive care refers to lifestyle choices and visits with your health care provider that can promote health and wellness. What does preventive care include?  A yearly physical exam. This is also called an annual well check.  Dental exams once or twice a year.  Routine eye exams. Ask your health care provider how often you should have your eyes checked.  Personal lifestyle choices, including:  Daily care of your teeth and gums.  Regular physical activity.  Eating a healthy diet.  Avoiding tobacco and drug use.  Limiting alcohol use.  Practicing safe sex.  Taking low doses of aspirin every day.  Taking vitamin and mineral supplements as recommended by your health care provider. What happens during an annual well check? The services and screenings done by your health care provider during your annual well check will depend on your age, overall health, lifestyle risk factors, and family history of disease.  Counseling  Your health care provider may ask you questions about your:  Alcohol use.  Tobacco use.  Drug use.  Emotional well-being.  Home and relationship well-being.  Sexual activity.  Eating habits.  History of falls.  Memory and ability to understand (cognition).  Work and work Astronomer. Screening  You may have the following tests or measurements:  Height, weight, and BMI.  Blood pressure.  Lipid and cholesterol levels. These may be checked every 5 years, or more frequently if you are over 70 years old.  Skin check.  Lung cancer screening. You may have this screening every year starting at age 26 if you have a 30-pack-year history of smoking and currently smoke or have quit within the past 15 years.  Fecal occult blood test (FOBT) of the stool. You may have this test every year starting at age 77.  Flexible sigmoidoscopy or colonoscopy. You may have a sigmoidoscopy every 5 years or a colonoscopy every 10 years starting at age 23.  Prostate cancer screening. Recommendations will vary depending on your family history and other risks.  Hepatitis C blood test.  Hepatitis B blood test.  Sexually transmitted disease (STD) testing.  Diabetes screening. This is done by checking your blood sugar (glucose) after you have not eaten for a while (fasting). You may have this done every 1-3 years.  Abdominal aortic aneurysm (AAA) screening. You may need this if you are a current or former smoker.  Osteoporosis. You may be screened starting at age 36 if you are at high risk. Talk with your health care provider about your test results, treatment options, and if necessary, the need for more tests. Vaccines  Your health care provider may recommend certain vaccines, such as:  Influenza vaccine. This is recommended every year.  Tetanus, diphtheria, and acellular pertussis (Tdap, Td) vaccine. You may need a Td booster every 10 years.  Zoster vaccine. You may need this  after age 65.  Pneumococcal 13-valent conjugate (PCV13) vaccine. One dose is recommended after age 42.  Pneumococcal polysaccharide (PPSV23) vaccine. One dose is recommended after age 93. Talk to your health care provider about which screenings and vaccines you need and how often you need them. This information is not intended to replace advice given to you by your health care provider. Make sure you discuss any questions you have with your health care provider. Document Released: 06/20/2015 Document Revised: 02/11/2016 Document Reviewed: 03/25/2015 Elsevier Interactive Patient Education  2017 [place] Prevention in the Home Falls can cause injuries. They can happen to people of all ages. There are many things you can do to make your home safe and to help prevent falls. What can I do on the outside of my home?  Regularly fix the edges of walkways and driveways and fix any cracks.  Remove anything that might make you trip as you walk through a door, such as a raised step or threshold.  Trim any bushes or trees on the path to your home.  Use bright outdoor lighting.  Clear any walking paths of anything that might make someone trip, such as rocks or tools.  Regularly check to see if handrails are loose or broken. Make sure that both sides of any steps have handrails.  Any raised decks and porches should have guardrails on the edges.  Have any leaves, snow, or ice cleared regularly.  Use sand or salt on walking paths during winter.  Clean up any spills in your garage right away. This includes oil or grease spills. What can I do in the bathroom?  Use night lights.  Install grab bars by the toilet and in the tub and shower. Do not use towel bars as grab bars.  Use non-skid mats or decals in the tub or shower.  If you need to sit down in the shower, use a plastic, non-slip stool.  Keep the floor dry. Clean up any water that spills on the floor as soon as it happens.   Remove soap buildup in the tub or shower regularly.  Attach bath mats securely with double-sided non-slip rug tape.  Do not have throw rugs and other things on the floor that can make you trip. What can I do in the bedroom?  Use night lights.  Make sure that you have a light by your bed that is easy to reach.  Do not use any sheets or blankets that are too big for your bed. They should not hang down onto the floor.  Have a firm chair that has side arms. You can use this for support while you get dressed.  Do not have throw rugs and other things on the floor that can make you trip. What can I do in the kitchen?  Clean up any spills right away.  Avoid walking on wet floors.  Keep items that you use a lot in easy-to-reach places.  If you need to reach something above you, use a strong step stool that has a grab bar.  Keep electrical cords out of the way.  Do not use floor polish or wax that makes floors slippery. If you must use wax, use non-skid floor wax.  Do  not have throw rugs and other things on the floor that can make you trip. What can I do with my stairs?  Do not leave any items on the stairs.  Make sure that there are handrails on both sides of the stairs and use them. Fix handrails that are broken or loose. Make sure that handrails are as long as the stairways.  Check any carpeting to make sure that it is firmly attached to the stairs. Fix any carpet that is loose or worn.  Avoid having throw rugs at the top or bottom of the stairs. If you do have throw rugs, attach them to the floor with carpet tape.  Make sure that you have a light switch at the top of the stairs and the bottom of the stairs. If you do not have them, ask someone to add them for you. What else can I do to help prevent falls?  Wear shoes that:  Do not have high heels.  Have rubber bottoms.  Are comfortable and fit you well.  Are closed at the toe. Do not wear sandals.  If you use a  stepladder:  Make sure that it is fully opened. Do not climb a closed stepladder.  Make sure that both sides of the stepladder are locked into place.  Ask someone to hold it for you, if possible.  Clearly mark and make sure that you can see:  Any grab bars or handrails.  First and last steps.  Where the edge of each step is.  Use tools that help you move around (mobility aids) if they are needed. These include:  Canes.  Walkers.  Scooters.  Crutches.  Turn on the lights when you go into a dark area. Replace any light bulbs as soon as they burn out.  Set up your furniture so you have a clear path. Avoid moving your furniture around.  If any of your floors are uneven, fix them.  If there are any pets around you, be aware of where they are.  Review your medicines with your doctor. Some medicines can make you feel dizzy. This can increase your chance of falling. Ask your doctor what other things that you can do to help prevent falls. This information is not intended to replace advice given to you by your health care provider. Make sure you discuss any questions you have with your health care provider. Document Released: 03/20/2009 Document Revised: 10/30/2015 Document Reviewed: 06/28/2014 Elsevier Interactive Patient Education  2017 [employer].

## 2019-09-10 NOTE — Progress Notes (Signed)
Subjective:   Robert Rasmussen is a 69 y.o. male who presents for an Initial Medicare Annual Wellness Visit.  This visit is being conducted via phone call  - after an attmept to do on video chat - due to the COVID-19 pandemic. This patient has given me verbal consent via phone to conduct this visit, patient states they are participating from their home address. Some vital signs may be absent or patient reported.   Patient identification: identified by name, DOB, and current address.    Review of Systems  Cardiac Risk Factors include: advanced age (>55men, >67 women);male gender;dyslipidemia    Objective:    There were no vitals filed for this visit. There is no height or weight on file to calculate BMI.  Advanced Directives 09/10/2019  Does Patient Have a Medical Advance Directive? Yes  Type of Advance Directive Living will;Healthcare Power of Attorney  Copy of Healthcare Power of Attorney in Chart? No - copy requested    Current Medications (verified) Outpatient Encounter Medications as of 09/10/2019  Medication Sig  . atorvastatin (LIPITOR) 10 MG tablet Take 1 tablet (10 mg total) by mouth daily.  . Cholecalciferol (VITAMIN D3) 25 MCG (1000 UT) CAPS Take 1 capsule by mouth daily.  . Multiple Vitamins-Minerals (ONE-A-DAY MENS 50+ ADVANTAGE) TABS Take 1 tablet by mouth daily.   No facility-administered encounter medications on file as of 09/10/2019.    Allergies (verified) Patient has no known allergies.   History: Past Medical History:  Diagnosis Date  . Allergy    seasonal  . Amebic dysentery   . Asthma   . Cellulitis   . GERD (gastroesophageal reflux disease)    Past Surgical History:  Procedure Laterality Date  . amebic dysentery    . TONSILLECTOMY     Family History  Problem Relation Age of Onset  . Cancer Mother        breast  . Breast cancer Mother   . Cancer Father        skin  . Skin cancer Father   . Cancer Sister        breast  . Breast cancer Sister    . Heart disease Paternal Grandmother        heart attack  . Prostate cancer Neg Hx   . Colon cancer Neg Hx    Social History   Socioeconomic History  . Marital status: Divorced    Spouse name: Not on file  . Number of children: Not on file  . Years of education: Not on file  . Highest education level: Not on file  Occupational History  . Occupation: retired  Tobacco Use  . Smoking status: Never Smoker  . Smokeless tobacco: Never Used  Substance and Sexual Activity  . Alcohol use: Yes    Alcohol/week: 0.0 standard drinks    Comment: pt states he has a drink about every 4 weeks  . Drug use: No  . Sexual activity: Yes  Other Topics Concern  . Not on file  Social History Narrative  . Not on file   Social Determinants of Health   Financial Resource Strain:   . Difficulty of Paying Living Expenses:   Food Insecurity:   . Worried About Programme researcher, broadcasting/film/video in the Last Year:   . Barista in the Last Year:   Transportation Needs:   . Freight forwarder (Medical):   Marland Kitchen Lack of Transportation (Non-Medical):   Physical Activity: Sufficiently Active  . Days  of Exercise per Week: 7 days  . Minutes of Exercise per Session: 60 min  Stress:   . Feeling of Stress :   Social Connections:   . Frequency of Communication with Friends and Family:   . Frequency of Social Gatherings with Friends and Family:   . Attends Religious Services:   . Active Member of Clubs or Organizations:   . Attends Archivist Meetings:   Marland Kitchen Marital Status:    Tobacco Counseling Counseling given: Not Answered   Clinical Intake:  Pre-visit preparation completed: Yes  Pain : No/denies pain     Nutritional Risks: None Diabetes: No  How often do you need to have someone help you when you read instructions, pamphlets, or other written materials from your doctor or pharmacy?: 1 - Never  Interpreter Needed?: No     Activities of Daily Living In your present state of health,  do you have any difficulty performing the following activities: 09/10/2019 08/20/2019  Hearing? N N  Vision? N N  Comment eyeglasses, Dr.Bell annually -  Difficulty concentrating or making decisions? N N  Walking or climbing stairs? N N  Dressing or bathing? N N  Doing errands, shopping? N N  Preparing Food and eating ? N -  Using the Toilet? N -  In the past six months, have you accidently leaked urine? N -  Do you have problems with loss of bowel control? N -  Managing your Medications? N -  Managing your Finances? N -  Housekeeping or managing your Housekeeping? N -  Some recent data might be hidden     Immunizations and Health Maintenance Immunization History  Administered Date(s) Administered  . Fluad Quad(high Dose 65+) 02/26/2019  . Influenza, High Dose Seasonal PF 03/13/2018  . Influenza,inj,Quad PF,6+ Mos 03/10/2015, 07/14/2016, 07/19/2017  . PFIZER SARS-COV-2 Vaccination 07/16/2019, 08/09/2019  . Pneumococcal Conjugate-13 03/13/2018  . Pneumococcal Polysaccharide-23 02/26/2019  . Td 08/23/2007  . Tdap 07/14/2016  . Zoster 03/23/2013   There are no preventive care reminders to display for this patient.  Patient Care Team: Venita Lick, NP as PCP - General (Nurse Practitioner)  Indicate any recent Medical Services you may have received from other than [facility] providers in the past year (date may be approximate).    Assessment:   This is a routine wellness examination for Robert Rasmussen.  Hearing/Vision screen No exam data present  Dietary issues and exercise activities discussed: Current Exercise Habits: The patient does not participate in regular exercise at present;Home exercise routine, Type of exercise: walking, Time (Minutes): 30(1-2 miles a day), Frequency (Times/Week): 7, Weekly Exercise (Minutes/Week): 210, Exercise limited by: None identified  Goals Addressed   None    Depression Screen PHQ 2/9 Scores 09/10/2019 08/20/2019 08/01/2018 08/01/2018  PHQ - 2 Score  0 0 0 0  PHQ- 9 Score - 2 0 0    Fall Risk Fall Risk  09/10/2019 08/20/2019 08/01/2018 07/19/2017 07/14/2016  Falls in the past year? 0 0 0 No No  Number falls in past yr: 0 0 0 - -  Injury with Fall? 0 0 0 - -    FALL RISK PREVENTION PERTAINING TO THE HOME:  Any stairs in or around the home? No  If so, are there any without handrails? No   Home free of loose throw rugs in walkways, pet beds, electrical cords, etc? Yes  Adequate lighting in your home to reduce risk of falls? Yes   ASSISTIVE DEVICES UTILIZED TO PREVENT FALLS:  Life alert? No  Use of a cane, walker or w/c? No  Grab bars in the bathroom? No  Shower chair or bench in shower? No  Elevated toilet seat or a handicapped toilet? No    TIMED UP AND GO:  Unable to perform   Cognitive Function:        Screening Tests Health Maintenance  Topic Date Due  . INFLUENZA VACCINE  01/06/2020  . Fecal DNA (Cologuard)  02/01/2020  . TETANUS/TDAP  07/14/2026  . Hepatitis C Screening  Completed  . PNA vac Low Risk Adult  Completed  . URINE MICROALBUMIN  Discontinued    Qualifies for Shingles Vaccine? Yes  Zostavax completed 2014. Due for Shingrix. Education has been provided regarding the importance of this vaccine. Pt has been advised to call insurance company to determine out of pocket expense. Advised may also receive vaccine at local pharmacy or Health Dept. Verbalized acceptance and understanding.  Tdap: up to date   Flu Vaccine: up to date   Pneumococcal Vaccine: up to date   Covid-19 Vaccine:  Completed vaccines  Cancer Screenings:  Colorectal Screening: Completed 2018. Repeat every 3 years  Lung Cancer Screening: (Low Dose CT Chest recommended if Age 16-80 years, 30 pack-year currently smoking OR have quit w/in 15years.) does not qualify.    Additional Screening:  Hepatitis C Screening: does qualify; Completed 2016  Vision Screening: Recommended annual ophthalmology exams for early detection of glaucoma  and other disorders of the eye. Is the patient up to date with their annual eye exam?  Yes  Who is the provider or what is the name of the office in which the pt attends annual eye exams? Dr.Bell    Dental Screening: Recommended annual dental exams for proper oral hygiene  Community Resource Referral:  CRR required this visit?  No        Plan:  I have personally reviewed and addressed the Medicare Annual Wellness questionnaire and have noted the following in the patient's chart:  A. Medical and social history B. Use of alcohol, tobacco or illicit drugs  C. Current medications and supplements D. Functional ability and status E.  Nutritional status F.  Physical activity G. Advance directives H. List of other physicians I.  Hospitalizations, surgeries, and ER visits in previous 12 months J.  Vitals K. Screenings such as hearing and vision if needed, cognitive and depression L. Referrals and appointments   In addition, I have reviewed and discussed with patient certain preventive protocols, quality metrics, and best practice recommendations. A written personalized care plan for preventive services as well as general preventive health recommendations were provided to patient.   Signed,    Collene Schlichter, LPN   0/07/7251  Nurse Health Advisor   Nurse Notes: none

## 2019-10-04 ENCOUNTER — Encounter: Payer: Self-pay | Admitting: Nurse Practitioner

## 2019-10-04 DIAGNOSIS — Z6841 Body Mass Index (BMI) 40.0 and over, adult: Secondary | ICD-10-CM | POA: Insufficient documentation

## 2019-10-09 ENCOUNTER — Ambulatory Visit (INDEPENDENT_AMBULATORY_CARE_PROVIDER_SITE_OTHER): Payer: Medicare HMO | Admitting: Nurse Practitioner

## 2019-10-09 ENCOUNTER — Other Ambulatory Visit: Payer: Self-pay

## 2019-10-09 ENCOUNTER — Encounter: Payer: Self-pay | Admitting: Nurse Practitioner

## 2019-10-09 DIAGNOSIS — H1011 Acute atopic conjunctivitis, right eye: Secondary | ICD-10-CM | POA: Diagnosis not present

## 2019-10-09 DIAGNOSIS — E870 Hyperosmolality and hypernatremia: Secondary | ICD-10-CM | POA: Diagnosis not present

## 2019-10-09 DIAGNOSIS — H1010 Acute atopic conjunctivitis, unspecified eye: Secondary | ICD-10-CM | POA: Insufficient documentation

## 2019-10-09 DIAGNOSIS — Z6841 Body Mass Index (BMI) 40.0 and over, adult: Secondary | ICD-10-CM | POA: Diagnosis not present

## 2019-10-09 DIAGNOSIS — E782 Mixed hyperlipidemia: Secondary | ICD-10-CM

## 2019-10-09 MED ORDER — OLOPATADINE HCL 0.1 % OP SOLN: 1.0000 [drp] | Freq: Two times a day (BID) | OPHTHALMIC | 5 refills | Status: DC

## 2019-10-09 NOTE — Assessment & Plan Note (Signed)
Recommended eating smaller high protein, low fat meals more frequently and exercising 30 mins a day 5 times a week with a goal of 10-15lb weight loss in the next 3 months. Patient voiced their understanding and motivation to adhere to these recommendations.  

## 2019-10-09 NOTE — Patient Instructions (Signed)
Allergic Conjunctivitis A clear membrane (conjunctiva) covers the white part of your eye and the inner surface of your eyelid. Allergic conjunctivitis happens when this membrane has inflammation. This is caused by allergies. Common causes of allergic reactions (allergens) include:  Outdoor allergens, such as: ? Pollen. ? Grass and weeds. ? Mold spores.  Indoor allergens, such as: ? Dust. ? Smoke. ? Mold. ? Pet dander. ? Animal hair. This condition can make your eye red or pink. It can also make your eye feel itchy. This condition cannot be spread from one person to another person (is not contagious). Follow these instructions at home:  Try not to be around things that you are allergic to.  Take or apply over-the-counter and prescription medicines only as told by your doctor. These include any eye drops.  Place a cool, clean washcloth on your eye for 10-20 minutes. Do this 3-4 times a day.  Do not touch or rub your eyes.  Do not wear contact lenses until the inflammation is gone. Wear glasses instead.  Do not wear eye makeup until the inflammation is gone.  Keep all follow-up visits as told by your doctor. This is important. Contact a doctor if:  Your symptoms get worse.  Your symptoms do not get better with treatment.  You have mild eye pain.  You are sensitive to light,  You have spots or blisters on your eyes.  You have pus coming from your eye.  You have a fever. Get help right away if:  You have redness, swelling, or other symptoms in only one eye.  Your vision is blurry.  You have vision changes.  You have very bad eye pain. Summary  Allergic conjunctivitis is caused by allergies. It can make your eye red or pink, and it can make your eye feel itchy.  This condition cannot be spread from one person to another person (is not contagious).  Try not to be around things that you are allergic to.  Take or apply over-the-counter and prescription medicines  only as told by your doctor. These include any eye drops.  Contact your doctor if your symptoms get worse or they do not get better with treatment. This information is not intended to replace advice given to you by your health care provider. Make sure you discuss any questions you have with your health care provider. Document Revised: 09/12/2018 Document Reviewed: 01/16/2016 Elsevier Patient Education  2020 Elsevier Inc.  

## 2019-10-09 NOTE — Assessment & Plan Note (Signed)
Refer to morbid obesity plan for further, recommend strong focus on diet and exercise.

## 2019-10-09 NOTE — Assessment & Plan Note (Signed)
Acute, improving.  Suspect allergic conjunctivitis based on exam findings and HPI.  No foreign bodies noted on exam.  Will send in script for Pataday and recommend continued use of Claritin or Allegra as needed.  Avoid rubbing eye, place cool wash cloth to eye for comfort.  Return to office for worsening or ongoing symptoms.

## 2019-10-09 NOTE — Assessment & Plan Note (Addendum)
Mild at 145 on recent labs with slightly elevated chloride.  Asymptomatic.  Recommend focus on good water intake at home + low sodium (DASH) diet to assist with HTN.  CMP today.

## 2019-10-09 NOTE — Assessment & Plan Note (Signed)
Chronic, ongoing.  ASCVD 24.8%.  Continue focus on diet changes and continue Atorvastatin 10 MG daily, recheck lipid panel today and adjust medication as needed.

## 2019-10-09 NOTE — Progress Notes (Signed)
BP 137/81 (BP Location: Left Arm, Cuff Size: Large)   Pulse 60   Temp 98.2 F (36.8 C) (Oral)   Wt (!) 348 lb (157.9 kg)   SpO2 92%   BMI 55.33 kg/m    Subjective:    Patient ID: Robert Rasmussen, male    DOB: Nov 02, 1950, 69 y.o.   MRN: [id number]  HPI: Robert Rasmussen is a 69 y.o. male  Chief Complaint  Patient presents with  . Hyperlipidemia    6 week f/up   HYPERLIPIDEMIA Statin, Atorvastatin 10 MG, started on 08/28/2019 due to risks and ASCVD 24.8%.  LDL on recent labs 107 and TCHOL 173.  Recent labs also noted NA+ 145, was recommended he increase water intake.  Denies any symptoms.  Reports first three weeks had some muscle pains and this went away. Hyperlipidemia status: good compliance Satisfied with current treatment?  yes Side effects:  no Medication compliance: good compliance Supplements: none Aspirin:  no The 10-year ASCVD risk score [name], et al., 2013) is: 28.1%   Values used to calculate the score:     Age: 49 years     Sex: Male     Is Non-Hispanic African American: No     Diabetic: Yes     Tobacco smoker: No     Systolic Blood Pressure: 137 mmHg     Is BP treated: No     HDL Cholesterol: 40 mg/dL     Total Cholesterol: 173 mg/dL Chest pain:  no Coronary artery disease:  no Family history CAD:  no Family history early CAD:  no   EYE PAIN Started having right eye pain this morning at 0600.  Reports it has been tearing.  Denies any recent trauma to eye or working with anything that could have gotten into eye.  Has been outside every day.  Does have some allergies, has asthma as a child.  Once or twice a month takes Zyrtec.  Goes to Dr. Alvester Morin in [place], has not seen in one year. Duration:  days Involved eye:  right Onset: sudden Severity: moderate  Quality: sharp for a few hours and now dull and improving Foreign body sensation:no Visual impairment: no Eye redness: yes Discharge: no Crusting or matting of eyelids: no Swelling:  no Photophobia: no Itching: no Tearing: yes Headache: no Floaters: no URI symptoms: no Contact lens use: no Close contacts with similar problems: no Eye trauma: no Aggravating factors: rubbing eye Alleviating factors: nothing Status: fluctuating Treatments attempted: nothing  Relevant past medical, surgical, family and social history reviewed and updated as indicated. Interim medical history since our last visit reviewed. Allergies and medications reviewed and updated.  Review of Systems  Constitutional: Negative for activity change, diaphoresis, fatigue and fever.  Eyes: Positive for pain and redness. Negative for photophobia, discharge, itching and visual disturbance.  Respiratory: Negative for cough, chest tightness, shortness of breath and wheezing.   Cardiovascular: Negative for chest pain, palpitations and leg swelling.  Neurological: Negative.   Psychiatric/Behavioral: Negative.     Per HPI unless specifically indicated above     Objective:    BP 137/81 (BP Location: Left Arm, Cuff Size: Large)   Pulse 60   Temp 98.2 F (36.8 C) (Oral)   Wt (!) 348 lb (157.9 kg)   SpO2 92%   BMI 55.33 kg/m   Wt Readings from Last 3 Encounters:  10/09/19 (!) 348 lb (157.9 kg)  08/28/19 (!) 346 lb (156.9 kg)  08/20/19 (!) 348 lb (  157.9 kg)    Physical Exam Vitals and nursing note reviewed.  Constitutional:      General: He is awake. He is not in acute distress.    Appearance: He is well-developed and well-groomed. He is morbidly obese. He is not ill-appearing.  HENT:     Head: Normocephalic and atraumatic.     Right Ear: Hearing normal. No drainage.     Left Ear: Hearing normal. No drainage.  Eyes:     General: Lids are normal. Lids are everted, no foreign bodies appreciated. No visual field deficit.       Right eye: No foreign body or discharge.        Left eye: No foreign body or discharge.     Extraocular Movements: Extraocular movements intact.      Conjunctiva/sclera:     Right eye: Right conjunctiva is injected. No exudate or hemorrhage.    Left eye: Left conjunctiva is not injected. No exudate or hemorrhage.    Pupils: Pupils are equal, round, and reactive to light.     Visual Fields: Right eye visual fields normal and left eye visual fields normal.     Comments: Right eye with mild tearing present.  Neck:     Vascular: No carotid bruit.  Cardiovascular:     Rate and Rhythm: Normal rate and regular rhythm.     Heart sounds: Normal heart sounds, S1 normal and S2 normal. No murmur. No gallop.   Pulmonary:     Effort: Pulmonary effort is normal. No accessory muscle usage or respiratory distress.     Breath sounds: Normal breath sounds.  Abdominal:     General: Bowel sounds are normal.     Palpations: Abdomen is soft.  Musculoskeletal:        General: Normal range of motion.     Cervical back: Normal range of motion and neck supple.     Right lower leg: No edema.     Left lower leg: No edema.  Skin:    General: Skin is warm and dry.     Capillary Refill: Capillary refill takes less than 2 seconds.  Neurological:     Mental Status: He is alert and oriented to person, place, and time.  Psychiatric:        Attention and Perception: Attention normal.        Mood and Affect: Mood normal.        Speech: Speech normal.        Behavior: Behavior normal. Behavior is cooperative.        Thought Content: Thought content normal.     Results for orders placed or performed in visit on 08/20/19  CBC with Differential/Platelet  Result Value Ref Range   WBC 7.7 3.4 - 10.8 x10E3/uL   RBC 4.43 4.14 - 5.80 x10E6/uL   Hemoglobin 14.5 13.0 - 17.7 g/dL   Hematocrit 85.6 31.4 - 51.0 %   MCV 96 79 - 97 fL   MCH 32.7 26.6 - 33.0 pg   MCHC 34.0 31.5 - 35.7 g/dL   RDW 97.0 26.3 - 78.5 %   Platelets 214 150 - 450 x10E3/uL   Neutrophils 56 Not Estab. %   Lymphs 29 Not Estab. %   Monocytes 10 Not Estab. %   Eos 3 Not Estab. %   Basos 1 Not  Estab. %   Neutrophils Absolute 4.3 1.4 - 7.0 x10E3/uL   Lymphocytes Absolute 2.2 0.7 - 3.1 x10E3/uL   Monocytes Absolute 0.8 0.1 -  0.9 x10E3/uL   EOS (ABSOLUTE) 0.2 0.0 - 0.4 x10E3/uL   Basophils Absolute 0.1 0.0 - 0.2 x10E3/uL   Immature Granulocytes 1 Not Estab. %   Immature Grans (Abs) 0.0 0.0 - 0.1 x10E3/uL  Comprehensive metabolic panel  Result Value Ref Range   Glucose 98 65 - 99 mg/dL   BUN 16 8 - 27 mg/dL   Creatinine, Ser 6.73 0.76 - 1.27 mg/dL   GFR calc non Af Amer 63 >59 mL/min/1.73   GFR calc Af Amer 73 >59 mL/min/1.73   BUN/Creatinine Ratio 13 10 - 24   Sodium 145 (H) 134 - 144 mmol/L   Potassium 4.9 3.5 - 5.2 mmol/L   Chloride 107 (H) 96 - 106 mmol/L   CO2 28 20 - 29 mmol/L   Calcium 9.3 8.6 - 10.2 mg/dL   Total Protein 6.3 6.0 - 8.5 g/dL   Albumin 4.3 3.8 - 4.8 g/dL   Globulin, Total 2.0 1.5 - 4.5 g/dL   Albumin/Globulin Ratio 2.2 1.2 - 2.2   Bilirubin Total 0.4 0.0 - 1.2 mg/dL   Alkaline Phosphatase 52 39 - 117 IU/L   AST 25 0 - 40 IU/L   ALT 27 0 - 44 IU/L  Lipid Panel w/o Chol/HDL Ratio  Result Value Ref Range   Cholesterol, Total 173 100 - 199 mg/dL   Triglycerides 419 0 - 149 mg/dL   HDL 40 >37 mg/dL   VLDL Cholesterol Cal 26 5 - 40 mg/dL   LDL Chol Calc (NIH) 902 (H) 0 - 99 mg/dL  TSH  Result Value Ref Range   TSH 2.000 0.450 - 4.500 uIU/mL  PSA  Result Value Ref Range   Prostate Specific Ag, Serum 0.3 0.0 - 4.0 ng/mL  VITAMIN D 25 Hydroxy (Vit-D Deficiency, Fractures)  Result Value Ref Range   Vit D, 25-Hydroxy 34.2 30.0 - 100.0 ng/mL      Assessment & Plan:   Problem List Items Addressed This Visit      Other   Morbid obesity (HCC) - Primary    Recommended eating smaller high protein, low fat meals more frequently and exercising 30 mins a day 5 times a week with a goal of 10-15lb weight loss in the next 3 months. Patient voiced their understanding and motivation to adhere to these recommendations.       Mixed hyperlipidemia     Chronic, ongoing.  ASCVD 24.8%.  Continue focus on diet changes and continue Atorvastatin 10 MG daily, recheck lipid panel today and adjust medication as needed.        Relevant Orders   Lipid Panel w/o Chol/HDL Ratio   Comprehensive metabolic panel   Hypernatremia    Mild at 145 on recent labs with slightly elevated chloride.  Asymptomatic.  Recommend focus on good water intake at home + low sodium (DASH) diet to assist with HTN.  CMP today.      BMI 50.0-59.9, adult [facility])    Refer to morbid obesity plan for further, recommend strong focus on diet and exercise.      Allergic conjunctivitis    Acute, improving.  Suspect allergic conjunctivitis based on exam findings and HPI.  No foreign bodies noted on exam.  Will send in script for Pataday and recommend continued use of Claritin or Allegra as needed.  Avoid rubbing eye, place cool wash cloth to eye for comfort.  Return to office for worsening or ongoing symptoms.          Follow up plan: Return  in about 6 months (around 04/10/2020) for HLD, HTN.

## 2019-10-10 LAB — COMPREHENSIVE METABOLIC PANEL
ALT: 24 IU/L (ref 0–44)
AST: 28 IU/L (ref 0–40)
Albumin/Globulin Ratio: 2 (ref 1.2–2.2)
Albumin: 4.2 g/dL (ref 3.8–4.8)
Alkaline Phosphatase: 51 IU/L (ref 39–117)
BUN/Creatinine Ratio: 13 (ref 10–24)
BUN: 14 mg/dL (ref 8–27)
Bilirubin Total: 0.5 mg/dL (ref 0.0–1.2)
CO2: 23 mmol/L (ref 20–29)
Calcium: 9.4 mg/dL (ref 8.6–10.2)
Chloride: 108 mmol/L — ABNORMAL HIGH (ref 96–106)
Creatinine, Ser: 1.04 mg/dL (ref 0.76–1.27)
GFR calc Af Amer: 86 mL/min/{1.73_m2} (ref >59)
GFR calc non Af Amer: 74 mL/min/{1.73_m2} (ref >59)
Globulin, Total: 2.1 g/dL (ref 1.5–4.5)
Glucose: 102 mg/dL — ABNORMAL HIGH (ref 65–99)
Potassium: 4.9 mmol/L (ref 3.5–5.2)
Sodium: 144 mmol/L (ref 134–144)
Total Protein: 6.3 g/dL (ref 6.0–8.5)

## 2019-10-10 LAB — LIPID PANEL W/O CHOL/HDL RATIO
Cholesterol, Total: 135 mg/dL (ref 100–199)
HDL: 46 mg/dL (ref >39)
LDL Chol Calc (NIH): 77 mg/dL (ref 0–99)
Triglycerides: 56 mg/dL (ref 0–149)
VLDL Cholesterol Cal: 12 mg/dL (ref 5–40)

## 2019-10-10 NOTE — Progress Notes (Signed)
Contacted via MyChart Good evening Robert Rasmussen, your labs have returned.  Wow!!  Cholesterol levels are looking better with LDL going from 107 to now 77 and total cholesterol 173 to 135.  Continue Atorvastatin daily as is offering benefit.  Kidney and liver function are remaining normal.  Overall looks good.  Have a great evening. Keep being awesome!! Kindest regards, Ronnita Paz

## 2020-01-14 DIAGNOSIS — G4733 Obstructive sleep apnea (adult) (pediatric): Secondary | ICD-10-CM | POA: Diagnosis not present

## 2020-01-30 DIAGNOSIS — R69 Illness, unspecified: Secondary | ICD-10-CM | POA: Diagnosis not present

## 2020-02-07 DIAGNOSIS — H524 Presbyopia: Secondary | ICD-10-CM | POA: Diagnosis not present

## 2020-02-19 ENCOUNTER — Telehealth: Payer: Self-pay | Admitting: Nurse Practitioner

## 2020-02-19 NOTE — Telephone Encounter (Signed)
LVM TO RESCHEDULE THIS APT DUE TO PROVIDER NOT IN OFFICE DUE TO FAMILY EMERGENCY 

## 2020-02-20 ENCOUNTER — Ambulatory Visit: Payer: Medicare HMO | Admitting: Nurse Practitioner

## 2020-02-27 NOTE — Progress Notes (Signed)
BP (!) 142/84 (BP Location: Left Arm, Cuff Size: Large)    Pulse 61    Temp 98.4 F (36.9 C) (Oral)    Wt (!) 350 lb 9.6 oz (159 kg)    SpO2 95%    BMI 55.74 kg/m    Subjective:    Patient ID: Robert Rasmussen, male    DOB: September 02, 1950, 69 y.o.   MRN: [id number]  HPI: Robert Rasmussen is a 69 y.o. male presenting for follow up.  Chief Complaint  Patient presents with   Hyperlipidemia   Hypertension   Labs Only    pt would like the lab done to check his blood type   HYPERTENSION / HYPERLIPIDEMIA Satisfied with current treatment? currently not on treatment for high blood pressure Duration of hypertension: months BP monitoring frequency: not checking BP medication side effects: n/a Past BP meds: none Duration of hyperlipidemia: chronic Cholesterol medication side effects: no Cholesterol supplements: none Past cholesterol medications: atorvastatin 10 mg  Medication compliance: excellent compliance Aspirin: no Recent stressors: no Recurrent headaches: no Visual changes: no Palpitations: no Dyspnea: no Chest pain: no Lower extremity edema: no Dizzy/lightheaded: no  SHOULDER PAIN Duration: weeks 6-8 weeks Involved shoulder: left Mechanism of injury: unknown Location: medial; started with inside shoulder now radiating down to upper arm Onset:sudden Severity: moderate to severe  Quality: ranges from dull ache to sharp pain  Frequency: intermittent with activity Radiation: yes to upper arm Aggravating factors: moving arm, sleeping on it, lifting heavy objects   Alleviating factors: Advil 200 mg daily , aspirin Status: stable Treatments attempted: NSAIDs   Relief with NSAIDs?:  significant Weakness: yes Numbness: no Decreased grip strength: no Redness: no Swelling: no Bruising: no Fevers: no   No Known Allergies  Outpatient Encounter Medications as of 02/28/2020  Medication Sig   atorvastatin (LIPITOR) 10 MG tablet Take 1 tablet (10 mg total) by mouth daily.    Cholecalciferol (VITAMIN D3) 25 MCG (1000 UT) CAPS Take 1 capsule by mouth daily.   Multiple Vitamins-Minerals (ONE-A-DAY MENS 50+ ADVANTAGE) TABS Take 1 tablet by mouth daily.   [DISCONTINUED] olopatadine (PATADAY) 0.1 % ophthalmic solution Place 1 drop into the right eye 2 (two) times daily.   No facility-administered encounter medications on file as of 02/28/2020.   Patient Active Problem List   Diagnosis Date Noted   Acute pain of left shoulder 02/28/2020   Allergic conjunctivitis 10/09/2019   BMI 50.0-59.9, adult (HCC) 10/04/2019   Hypernatremia 08/28/2019   Mixed hyperlipidemia 08/01/2018   Sciatic pain 08/01/2018   Vitamin D deficiency 07/30/2018   Benign hypertension 06/08/2017   Morbid obesity (HCC) 03/10/2015   Sleep apnea 03/10/2015   Past Medical History:  Diagnosis Date   Allergy    seasonal   Amebic dysentery    Asthma    Cellulitis    GERD (gastroesophageal reflux disease)    Relevant past medical, surgical, family and social history reviewed and updated as indicated. Interim medical history since our last visit reviewed.  Review of Systems  Constitutional: Negative.   Eyes: Negative.   Respiratory: Negative.   Cardiovascular: Negative.   Gastrointestinal: Negative.   Musculoskeletal: Positive for arthralgias (left shoulder pain). Negative for back pain, gait problem, neck pain and neck stiffness.  Skin: Negative.  Negative for color change.  Neurological: Negative.   Psychiatric/Behavioral: Negative.     Per HPI unless specifically indicated above     Objective:    BP (!) 142/84 (BP Location: Left Arm, Cuff Size:  Large)    Pulse 61    Temp 98.4 F (36.9 C) (Oral)    Wt (!) 350 lb 9.6 oz (159 kg)    SpO2 95%    BMI 55.74 kg/m   Wt Readings from Last 3 Encounters:  02/28/20 (!) 350 lb 9.6 oz (159 kg)  10/09/19 (!) 348 lb (157.9 kg)  08/28/19 (!) 346 lb (156.9 kg)    Physical Exam Vitals and nursing note reviewed.  Constitutional:       General: He is not in acute distress.    Appearance: Normal appearance. He is obese.  Eyes:     General: No scleral icterus.    Extraocular Movements: Extraocular movements intact.  Neck:     Vascular: No carotid bruit.  Cardiovascular:     Rate and Rhythm: Normal rate.     Heart sounds: Normal heart sounds. No murmur heard.   Pulmonary:     Effort: Pulmonary effort is normal. No respiratory distress.     Breath sounds: Normal breath sounds. No wheezing or rhonchi.  Abdominal:     General: Abdomen is flat. Bowel sounds are normal. There is no distension.  Musculoskeletal:        General: Normal range of motion.     Right shoulder: Normal.     Left shoulder: Tenderness (pain with ROM) present. No effusion, laceration or bony tenderness. Normal range of motion. Normal strength.     Cervical back: Normal range of motion.     Right lower leg: No edema.     Left lower leg: No edema.  Skin:    General: Skin is warm and dry.     Coloration: Skin is not jaundiced or pale.  Neurological:     General: No focal deficit present.     Mental Status: He is alert and oriented to person, place, and time.     Motor: No weakness.     Gait: Gait normal.  Psychiatric:        Mood and Affect: Mood normal.        Behavior: Behavior normal.        Thought Content: Thought content normal.        Judgment: Judgment normal.       Assessment & Plan:   Problem List Items Addressed This Visit      Cardiovascular and Mediastinum   Benign hypertension    Above goal of <130/80 in office today.  Encouraged to obtain blood pressure cuff and check at home daily.  Return to clinic if stays >130/80 at home.  Will check BMP today.        Other   Vitamin D deficiency    Chronic, improved on last check.  Will recheck today to ensure continued good response to supplementation.  Continue daily supplementation.      Relevant Orders   VITAMIN D 25 Hydroxy (Vit-D Deficiency, Fractures)   Mixed  hyperlipidemia    Chronic, stable. ASCVD risk down to 14.3% from starting atorvastatin 10 mg.  Recheck lipids today, will adjust medication as indicated.  Continue diet and lifestyle changes.         Relevant Orders   Lipid Panel w/o Chol/HDL Ratio   BMI 50.0-59.9, adult (HCC) - Primary    Continue with diet and lifestyle changes, encouraged 30 minutes of activity 5 times per week and to eat small meals high in protein and fiber.  Will check HgbA1c today to screen for diabetes.      Relevant  Orders   HgB A1c   ABO/Rh   Acute pain of left shoulder    Acute x 6-8 weeks, ongoing.  Good ROM on examination and no red flags, likely strained.  Start shoulder exercises, ice, and Tylenol as needed.  If not better in 4 weeks, can consider physical therapy or imaging.       Other Visit Diagnoses    Need for influenza vaccination       Relevant Orders   Flu Vaccine QUAD High Dose(Fluad) (Completed)   Screening for colon cancer       Relevant Orders   Cologuard   ABO/Rh   Encounter for screening for diabetes mellitus       Relevant Orders   HgB A1c   ABO/Rh       Follow up plan: Return in about 6 months (around 08/27/2020) for HLD follow up.

## 2020-02-28 ENCOUNTER — Other Ambulatory Visit: Payer: Self-pay

## 2020-02-28 ENCOUNTER — Ambulatory Visit (INDEPENDENT_AMBULATORY_CARE_PROVIDER_SITE_OTHER): Payer: Medicare HMO | Admitting: Nurse Practitioner

## 2020-02-28 ENCOUNTER — Encounter: Payer: Self-pay | Admitting: Nurse Practitioner

## 2020-02-28 VITALS — BP 142/84 | HR 61 | Temp 98.4°F | Wt 350.6 lb

## 2020-02-28 DIAGNOSIS — M25512 Pain in left shoulder: Secondary | ICD-10-CM | POA: Diagnosis not present

## 2020-02-28 DIAGNOSIS — Z131 Encounter for screening for diabetes mellitus: Secondary | ICD-10-CM | POA: Diagnosis not present

## 2020-02-28 DIAGNOSIS — E559 Vitamin D deficiency, unspecified: Secondary | ICD-10-CM

## 2020-02-28 DIAGNOSIS — I10 Essential (primary) hypertension: Secondary | ICD-10-CM | POA: Diagnosis not present

## 2020-02-28 DIAGNOSIS — Z1211 Encounter for screening for malignant neoplasm of colon: Secondary | ICD-10-CM

## 2020-02-28 DIAGNOSIS — Z23 Encounter for immunization: Secondary | ICD-10-CM | POA: Diagnosis not present

## 2020-02-28 DIAGNOSIS — E782 Mixed hyperlipidemia: Secondary | ICD-10-CM

## 2020-02-28 DIAGNOSIS — Z6841 Body Mass Index (BMI) 40.0 and over, adult: Secondary | ICD-10-CM | POA: Diagnosis not present

## 2020-02-28 DIAGNOSIS — R739 Hyperglycemia, unspecified: Secondary | ICD-10-CM | POA: Diagnosis not present

## 2020-02-28 DIAGNOSIS — G8929 Other chronic pain: Secondary | ICD-10-CM | POA: Insufficient documentation

## 2020-02-28 NOTE — Patient Instructions (Signed)
Shoulder Exercises Ask your health care provider which exercises are safe for you. Do exercises exactly as told by your health care provider and adjust them as directed. It is normal to feel mild stretching, pulling, tightness, or discomfort as you do these exercises. Stop right away if you feel sudden pain or your pain gets worse. Do not begin these exercises until told by your health care provider. Stretching exercises External rotation and abduction This exercise is sometimes called corner stretch. This exercise rotates your arm outward (external rotation) and moves your arm out from your body (abduction). 1. Stand in a doorway with one of your feet slightly in front of the other. This is called a staggered stance. If you cannot reach your forearms to the door frame, stand facing a corner of a room. 2. Choose one of the following positions as told by your health care provider: ? Place your hands and forearms on the door frame above your head. ? Place your hands and forearms on the door frame at the height of your head. ? Place your hands on the door frame at the height of your elbows. 3. Slowly move your weight onto your front foot until you feel a stretch across your chest and in the front of your shoulders. Keep your head and chest upright and keep your abdominal muscles tight. 4. Hold for __________ seconds. 5. To release the stretch, shift your weight to your back foot. Repeat __________ times. Complete this exercise __________ times a day. Extension, standing 1. Stand and hold a broomstick, a cane, or a similar object behind your back. ? Your hands should be a little wider than shoulder width apart. ? Your palms should face away from your back. 2. Keeping your elbows straight and your shoulder muscles relaxed, move the stick away from your body until you feel a stretch in your shoulders (extension). ? Avoid shrugging your shoulders while you move the stick. Keep your shoulder blades tucked  down toward the middle of your back. 3. Hold for __________ seconds. 4. Slowly return to the starting position. Repeat __________ times. Complete this exercise __________ times a day. Range-of-motion exercises Pendulum  1. Stand near a wall or a surface that you can hold onto for balance. 2. Bend at the waist and let your left / right arm hang straight down. Use your other arm to support you. Keep your back straight and do not lock your knees. 3. Relax your left / right arm and shoulder muscles, and move your hips and your trunk so your left / right arm swings freely. Your arm should swing because of the motion of your body, not because you are using your arm or shoulder muscles. 4. Keep moving your hips and trunk so your arm swings in the following directions, as told by your health care provider: ? Side to side. ? Forward and backward. ? In clockwise and counterclockwise circles. 5. Continue each motion for __________ seconds, or for as long as told by your health care provider. 6. Slowly return to the starting position. Repeat __________ times. Complete this exercise __________ times a day. Shoulder flexion, standing  1. Stand and hold a broomstick, a cane, or a similar object. Place your hands a little more than shoulder width apart on the object. Your left / right hand should be palm up, and your other hand should be palm down. 2. Keep your elbow straight and your shoulder muscles relaxed. Push the stick up with your healthy arm to   raise your left / right arm in front of your body, and then over your head until you feel a stretch in your shoulder (flexion). ? Avoid shrugging your shoulder while you raise your arm. Keep your shoulder blade tucked down toward the middle of your back. 3. Hold for __________ seconds. 4. Slowly return to the starting position. Repeat __________ times. Complete this exercise __________ times a day. Shoulder abduction, standing 1. Stand and hold a broomstick,  a cane, or a similar object. Place your hands a little more than shoulder width apart on the object. Your left / right hand should be palm up, and your other hand should be palm down. 2. Keep your elbow straight and your shoulder muscles relaxed. Push the object across your body toward your left / right side. Raise your left / right arm to the side of your body (abduction) until you feel a stretch in your shoulder. ? Do not raise your arm above shoulder height unless your health care provider tells you to do that. ? If directed, raise your arm over your head. ? Avoid shrugging your shoulder while you raise your arm. Keep your shoulder blade tucked down toward the middle of your back. 3. Hold for __________ seconds. 4. Slowly return to the starting position. Repeat __________ times. Complete this exercise __________ times a day. Internal rotation  1. Place your left / right hand behind your back, palm up. 2. Use your other hand to dangle an exercise band, a towel, or a similar object over your shoulder. Grasp the band with your left / right hand so you are holding on to both ends. 3. Gently pull up on the band until you feel a stretch in the front of your left / right shoulder. The movement of your arm toward the center of your body is called internal rotation. ? Avoid shrugging your shoulder while you raise your arm. Keep your shoulder blade tucked down toward the middle of your back. 4. Hold for __________ seconds. 5. Release the stretch by letting go of the band and lowering your hands. Repeat __________ times. Complete this exercise __________ times a day. Strengthening exercises External rotation  1. Sit in a stable chair without armrests. 2. Secure an exercise band to a stable object at elbow height on your left / right side. 3. Place a soft object, such as a folded towel or a small pillow, between your left / right upper arm and your body to move your elbow about 4 inches (10 cm) away  from your side. 4. Hold the end of the exercise band so it is tight and there is no slack. 5. Keeping your elbow pressed against the soft object, slowly move your forearm out, away from your abdomen (external rotation). Keep your body steady so only your forearm moves. 6. Hold for __________ seconds. 7. Slowly return to the starting position. Repeat __________ times. Complete this exercise __________ times a day. Shoulder abduction  1. Sit in a stable chair without armrests, or stand up. 2. Hold a __________ weight in your left / right hand, or hold an exercise band with both hands. 3. Start with your arms straight down and your left / right palm facing in, toward your body. 4. Slowly lift your left / right hand out to your side (abduction). Do not lift your hand above shoulder height unless your health care provider tells you that this is safe. ? Keep your arms straight. ? Avoid shrugging your shoulder while you   do this movement. Keep your shoulder blade tucked down toward the middle of your back. 5. Hold for __________ seconds. 6. Slowly lower your arm, and return to the starting position. Repeat __________ times. Complete this exercise __________ times a day. Shoulder extension 1. Sit in a stable chair without armrests, or stand up. 2. Secure an exercise band to a stable object in front of you so it is at shoulder height. 3. Hold one end of the exercise band in each hand. Your palms should face each other. 4. Straighten your elbows and lift your hands up to shoulder height. 5. Step back, away from the secured end of the exercise band, until the band is tight and there is no slack. 6. Squeeze your shoulder blades together as you pull your hands down to the sides of your thighs (extension). Stop when your hands are straight down by your sides. Do not let your hands go behind your body. 7. Hold for __________ seconds. 8. Slowly return to the starting position. Repeat __________ times.  Complete this exercise __________ times a day. Shoulder row 1. Sit in a stable chair without armrests, or stand up. 2. Secure an exercise band to a stable object in front of you so it is at waist height. 3. Hold one end of the exercise band in each hand. Position your palms so that your thumbs are facing the ceiling (neutral position). 4. Bend each of your elbows to a 90-degree angle (right angle) and keep your upper arms at your sides. 5. Step back until the band is tight and there is no slack. 6. Slowly pull your elbows back behind you. 7. Hold for __________ seconds. 8. Slowly return to the starting position. Repeat __________ times. Complete this exercise __________ times a day. Shoulder press-ups  1. Sit in a stable chair that has armrests. Sit upright, with your feet flat on the floor. 2. Put your hands on the armrests so your elbows are bent and your fingers are pointing forward. Your hands should be about even with the sides of your body. 3. Push down on the armrests and use your arms to lift yourself off the chair. Straighten your elbows and lift yourself up as much as you comfortably can. ? Move your shoulder blades down, and avoid letting your shoulders move up toward your ears. ? Keep your feet on the ground. As you get stronger, your feet should support less of your body weight as you lift yourself up. 4. Hold for __________ seconds. 5. Slowly lower yourself back into the chair. Repeat __________ times. Complete this exercise __________ times a day. Wall push-ups  1. Stand so you are facing a stable wall. Your feet should be about one arm-length away from the wall. 2. Lean forward and place your palms on the wall at shoulder height. 3. Keep your feet flat on the floor as you bend your elbows and lean forward toward the wall. 4. Hold for __________ seconds. 5. Straighten your elbows to push yourself back to the starting position. Repeat __________ times. Complete this exercise  __________ times a day. This information is not intended to replace advice given to you by your health care provider. Make sure you discuss any questions you have with your health care provider. Document Revised: 09/15/2018 Document Reviewed: 06/23/2018 [employer] Patient Education  2020 [employer]  

## 2020-02-28 NOTE — Assessment & Plan Note (Signed)
Chronic, improved on last check.  Will recheck today to ensure continued good response to supplementation.  Continue daily supplementation.

## 2020-02-28 NOTE — Assessment & Plan Note (Signed)
Above goal of <130/80 in office today.  Encouraged to obtain blood pressure cuff and check at home daily.  Return to clinic if stays >130/80 at home.  Will check BMP today.

## 2020-02-28 NOTE — Assessment & Plan Note (Addendum)
Continue with diet and lifestyle changes, encouraged 30 minutes of activity 5 times per week and to eat small meals high in protein and fiber.  Will check HgbA1c today to screen for diabetes.

## 2020-02-28 NOTE — Assessment & Plan Note (Signed)
Acute x 6-8 weeks, ongoing.  Good ROM on examination and no red flags, likely strained.  Start shoulder exercises, ice, and Tylenol as needed.  If not better in 4 weeks, can consider physical therapy or imaging.

## 2020-02-28 NOTE — Assessment & Plan Note (Signed)
Chronic, stable. ASCVD risk down to 14.3% from starting atorvastatin 10 mg.  Recheck lipids today, will adjust medication as indicated.  Continue diet and lifestyle changes.

## 2020-02-29 ENCOUNTER — Encounter: Payer: Self-pay | Admitting: Nurse Practitioner

## 2020-02-29 DIAGNOSIS — R7303 Prediabetes: Secondary | ICD-10-CM | POA: Insufficient documentation

## 2020-02-29 LAB — HEMOGLOBIN A1C
Est. average glucose Bld gHb Est-mCnc: 123 mg/dL
Hgb A1c MFr Bld: 5.9 % — ABNORMAL HIGH (ref 4.8–5.6)

## 2020-02-29 LAB — LIPID PANEL W/O CHOL/HDL RATIO
Cholesterol, Total: 147 mg/dL (ref 100–199)
HDL: 50 mg/dL (ref >39)
LDL Chol Calc (NIH): 82 mg/dL (ref 0–99)
Triglycerides: 74 mg/dL (ref 0–149)
VLDL Cholesterol Cal: 15 mg/dL (ref 5–40)

## 2020-02-29 LAB — ABO/RH: Rh Factor: POSITIVE

## 2020-02-29 LAB — VITAMIN D 25 HYDROXY (VIT D DEFICIENCY, FRACTURES): Vit D, 25-Hydroxy: 37.5 ng/mL (ref 30.0–100.0)

## 2020-03-10 ENCOUNTER — Encounter: Payer: Self-pay | Admitting: Nurse Practitioner

## 2020-03-25 ENCOUNTER — Ambulatory Visit: Payer: Medicare HMO | Attending: Internal Medicine

## 2020-03-25 DIAGNOSIS — Z23 Encounter for immunization: Secondary | ICD-10-CM

## 2020-03-25 NOTE — Progress Notes (Signed)
° °  [facility]ov[facility] Vacc[facility]  Name:  Robert Rasmussen    MRN: [id number] DOB: [birth year]  03/25/2020  Robert Rasmussen was observed post Covid-19 immunization for 15 minutes without incident. He was provided with Vaccine Information Sheet and instruction to access the V-Safe system.   Robert Rasmussen was instructed to call 911 with any severe reactions post vaccine:  Difficulty breathing   Swelling of face and throat   A fast heartbeat   A bad rash all over body   Dizziness and weakness

## 2020-03-26 ENCOUNTER — Other Ambulatory Visit: Payer: Self-pay | Admitting: Internal Medicine

## 2020-05-06 DIAGNOSIS — Z1211 Encounter for screening for malignant neoplasm of colon: Secondary | ICD-10-CM | POA: Diagnosis not present

## 2020-05-06 LAB — FECAL OCCULT BLOOD, IMMUNOCHEMICAL: IFOBT: NEGATIVE

## 2020-06-03 DIAGNOSIS — G4733 Obstructive sleep apnea (adult) (pediatric): Secondary | ICD-10-CM | POA: Diagnosis not present

## 2020-06-16 ENCOUNTER — Ambulatory Visit (INDEPENDENT_AMBULATORY_CARE_PROVIDER_SITE_OTHER): Payer: Medicare HMO | Admitting: Internal Medicine

## 2020-06-16 VITALS — BP 153/76 | HR 61 | Ht 66.0 in | Wt 358.0 lb

## 2020-06-16 DIAGNOSIS — R030 Elevated blood-pressure reading, without diagnosis of hypertension: Secondary | ICD-10-CM | POA: Insufficient documentation

## 2020-06-16 DIAGNOSIS — Z9989 Dependence on other enabling machines and devices: Secondary | ICD-10-CM

## 2020-06-16 DIAGNOSIS — Z7189 Other specified counseling: Secondary | ICD-10-CM | POA: Insufficient documentation

## 2020-06-16 DIAGNOSIS — G4733 Obstructive sleep apnea (adult) (pediatric): Secondary | ICD-10-CM | POA: Diagnosis not present

## 2020-06-16 NOTE — Progress Notes (Signed)
[facility] [address] [place], [place] [address]  Pulmonary Sleep Medicine   Office Visit Note  Patient Name: Robert Rasmussen DOB: 02-18-1951 MRN [id number]    Chief Complaint: Obstructive Sleep Apnea visit  Brief History:  [name] is seen today for initial consultation The patient has a 6 year history of sleep apnea. Patient is  using PAP nightly.  The patient feels more rested after sleeping with PAP.  The patient reports benefiting from PAP use. Reported sleepiness is  improved and the Epworth Sleepiness Score is 3 out of 24. The patient does not take naps. The patient complains of the following: no complaints. Mask leak is elevated but he has just switched to a nasal mask and likes this better  The compliance download shows excellent  compliance with an average use time of 8.9 hours. The AHI is 0.3  The patient does not complain of limb movements disrupting sleep.  ROS  General: (-) fever, (-) chills, (-) night sweat Nose and Sinuses: (-) nasal stuffiness or itchiness, (-) postnasal drip, (-) nosebleeds, (-) sinus trouble. Mouth and Throat: (-) sore throat, (-) hoarseness. Neck: (-) swollen glands, (-) enlarged thyroid, (-) neck pain. Respiratory: - cough, - shortness of breath, - wheezing. Neurologic: - numbness, - tingling. Psychiatric: - anxiety, - depression   Current Medication: Outpatient Encounter Medications as of 06/16/2020  Medication Sig  . atorvastatin (LIPITOR) 10 MG tablet Take 1 tablet (10 mg total) by mouth daily.  . Cholecalciferol (VITAMIN D3) 25 MCG (1000 UT) CAPS Take 1 capsule by mouth daily.  . Multiple Vitamins-Minerals (ONE-A-DAY MENS 50+ ADVANTAGE) TABS Take 1 tablet by mouth daily.   No facility-administered encounter medications on file as of 06/16/2020.    Surgical History: Past Surgical History:  Procedure Laterality Date  . TONSILLECTOMY      Medical History: Past Medical History:  Diagnosis Date  . Allergy    seasonal   . Amebic dysentery   . Asthma   . Cellulitis   . GERD (gastroesophageal reflux disease)     Family History: Non contributory to the present illness  Social History: Social History   Socioeconomic History  . Marital status: Divorced    Spouse name: Not on file  . Number of children: Not on file  . Years of education: Not on file  . Highest education level: Not on file  Occupational History  . Occupation: retired  Tobacco Use  . Smoking status: Never Smoker  . Smokeless tobacco: Never Used  Vaping Use  . Vaping Use: Never used  Substance and Sexual Activity  . Alcohol use: Not Currently  . Drug use: No  . Sexual activity: Yes  Other Topics Concern  . Not on file  Social History Narrative  . Not on file   Social Determinants of Health   Financial Resource Strain: Not on file  Food Insecurity: Not on file  Transportation Needs: Not on file  Physical Activity: Sufficiently Active  . Days of Exercise per Week: 7 days  . Minutes of Exercise per Session: 60 min  Stress: Not on file  Social Connections: Not on file  Intimate Partner Violence: Not on file    Vital Signs: Blood pressure (!) 153/76, pulse 61, height 5\' 6"  (1.676 m), weight (!) 358 lb (162.4 kg), SpO2 95 %.  Examination: General Appearance: The patient is well-developed, well-nourished, and in no distress. Neck Circumference: 56 Skin: Gross inspection of skin unremarkable. Head: normocephalic, no gross deformities. Eyes: no gross  deformities noted. ENT: ears appear grossly normal Neurologic: Alert and oriented. No involuntary movements.    EPWORTH SLEEPINESS SCALE:  Scale:  (0)= no chance of dozing; (1)= slight chance of dozing; (2)= moderate chance of dozing; (3)= high chance of dozing  Chance  Situtation    Sitting and reading: 0    Watching TV: 1    Sitting Inactive in public: 0    As a passenger in car: 0      Lying down to rest: 2    Sitting and talking: 0    Sitting quielty  after lunch: 0    In a car, stopped in traffic: 0   TOTAL SCORE:   3 out of 24    SLEEP STUDIES:  1. Split 03-23-15 AHI 104 SpO21min 85%, APAP 9-12 cmH2O   CPAP COMPLIANCE DATA:  Date Range: 12/18/19-06/14/20  Average Daily Use: 8.9 hours  Median Use: 8.9  Compliance for > 4 Hours: 100%  AHI: 0.3 respiratory events per hour  Days Used: 180/180  Mask Leak: 2.3 hours  95th Percentile Pressure: 11   LABS: Recent Results (from the past 2160 hour(s))  Fecal occult blood, imunochemical     Status: None   Collection Time: 05/06/20 12:00 AM  Result Value Ref Range   IFOBT Negative     Radiology: DG Chest 2 View  Result Date: 06/13/2017 CLINICAL DATA:  One month of nonproductive cough worrisome for bronchitis or pneumonia. Mild exertional shortness of breath. History of childhood asthma. Nonsmoker. EXAM: CHEST  2 VIEW COMPARISON:  None in PACs FINDINGS: The lungs are well-expanded. There is no focal infiltrate. The interstitial markings are prominent especially in the lower lobes. There is pleural thickening along the lateral thoracic cage bilaterally. There is no pleural effusion. The heart is top-normal in size. The pulmonary vascularity is normal. The bony thorax exhibits no acute abnormality. IMPRESSION: There is no pneumonia nor other acute cardiopulmonary abnormality. Mild interstitial prominence likely reflects bronchitic changes either acute or chronic. Electronically Signed   By: David  [place] M.D.   On: 06/13/2017 09:55    No results found.  No results found.    Assessment and Plan: Patient Active Problem List   Diagnosis Date Noted  . OSA on CPAP 06/16/2020  . CPAP use counseling 06/16/2020  . Elevated blood pressure reading 06/16/2020  . Prediabetes 02/29/2020  . Acute pain of left shoulder 02/28/2020  . Allergic conjunctivitis 10/09/2019  . BMI 50.0-59.9, adult (HCC) 10/04/2019  . Hypernatremia 08/28/2019  . Mixed hyperlipidemia 08/01/2018  . Sciatic  pain 08/01/2018  . Vitamin D deficiency 07/30/2018  . Benign hypertension 06/08/2017  . Morbid obesity (HCC) 03/10/2015  . Sleep apnea 03/10/2015      The patient does tolerate PAP and reports significant benefit from PAP use. The patient was reminded how to clean the unit.  The patient was also counselled on the importance of a good diet and regular exercise. The compliance is excellent. The apnea is well controlled. His machine is past end of life and must be replaced.   1. OSA- continue excellent compliance. Follow in 30 + days after set up. 2. CPAP couseling-Discussed importance of adequate CPAP use as well as proper care and cleaning techniques of machine and all supplies. 3. Elevated BP reading-No history of HTN, asymptomatic, encouraged to monitor BP and follow-up with PCP with elevated readings 4. Morbid obesity-Discussed the importance of weight management through healthy eating and daily exercise as tolerated. Discussed the negative effects obesity  has on pulmonary health, cardiac health as well as overall general health and well being.  General Counseling: I have discussed the findings of the evaluation and examination with Johnmatthew.  I have also discussed any further diagnostic evaluation thatmay be needed or ordered today. Sina verbalizes understanding of the findings of todays visit. We also reviewed his medications today and discussed drug interactions and side effects including but not limited excessive drowsiness and altered mental states. We also discussed that there is always a risk not just to him but also people around him. he has been encouraged to call the office with any questions or concerns that should arise related to todays visit.  I have personally obtained a history, examined the patient, evaluated laboratory and imaging results, formulated the assessment and plan and placed orders.  This patient was seen by Theotis Burrow, AGNP-C in collaboration with Dr. Freda Munro as a part of collaborative care agreement.  Valentino Hue Sol Blazing, PhD, FAASM  Diplomate, American Board of Sleep Medicine    Yevonne Pax, MD [facility] Diplomate ABMS Pulmonary and Critical Care Medicine Sleep medicine

## 2020-06-16 NOTE — Patient Instructions (Signed)

## 2020-07-21 DIAGNOSIS — Z1212 Encounter for screening for malignant neoplasm of rectum: Secondary | ICD-10-CM | POA: Diagnosis not present

## 2020-07-21 DIAGNOSIS — Z1211 Encounter for screening for malignant neoplasm of colon: Secondary | ICD-10-CM | POA: Diagnosis not present

## 2020-07-21 LAB — COLOGUARD: Cologuard: NEGATIVE

## 2020-07-24 LAB — COLOGUARD: COLOGUARD: NEGATIVE

## 2020-07-24 LAB — EXTERNAL GENERIC LAB PROCEDURE: COLOGUARD: NEGATIVE

## 2020-07-30 DIAGNOSIS — G4733 Obstructive sleep apnea (adult) (pediatric): Secondary | ICD-10-CM | POA: Diagnosis not present

## 2020-08-11 ENCOUNTER — Telehealth: Payer: Self-pay | Admitting: Nurse Practitioner

## 2020-08-11 NOTE — Telephone Encounter (Signed)
Copied from CRM [id number]. Topic: Medicare AWV >> Aug 11, 2020  1:48 PM Claudette Laws R wrote: Reason for CRM:   08/11/20 LM-  AWVS has been rescheduled from August 20, 2020 to March 21,2022 at 1:45 pm and will be a phone visit not in office -srs

## 2020-08-20 ENCOUNTER — Ambulatory Visit: Payer: Medicare HMO

## 2020-08-25 ENCOUNTER — Ambulatory Visit (INDEPENDENT_AMBULATORY_CARE_PROVIDER_SITE_OTHER): Payer: Medicare HMO

## 2020-08-25 VITALS — BP 119/77 | HR 64 | Ht 67.0 in | Wt 350.0 lb

## 2020-08-25 DIAGNOSIS — Z0000 Encounter for general adult medical examination without abnormal findings: Secondary | ICD-10-CM | POA: Diagnosis not present

## 2020-08-25 NOTE — Patient Instructions (Signed)
Robert Rasmussen , Thank you for taking time to come for your Medicare Wellness Visit. I appreciate your ongoing commitment to your health goals. Please review the following plan we discussed and let me know if I can assist you in the future.   Screening recommendations/referrals: Colonoscopy: FOBT 05/06/2020 Recommended yearly ophthalmology/optometry visit for glaucoma screening and checkup Recommended yearly dental visit for hygiene and checkup  Vaccinations: Influenza vaccine: completed 02/28/2020, due 01/05/2021 Pneumococcal vaccine: completed 02/26/2019 Tdap vaccine: completed 07/14/2016, due 07/14/2026 Shingles vaccine: needs second dose   Covid-19:  03/25/2020, 08/09/2019, 07/16/2019  Advanced directives: Please bring a copy of your POA (Power of Attorney) and/or Living Will to your next appointment.   Conditions/risks identified: none  Next appointment: Follow up in one year for your annual wellness visit.   Preventive Care 107 Years and Older, Male Preventive care refers to lifestyle choices and visits with your health care provider that can promote health and wellness. What does preventive care include?  A yearly physical exam. This is also called an annual well check.  Dental exams once or twice a year.  Routine eye exams. Ask your health care provider how often you should have your eyes checked.  Personal lifestyle choices, including:  Daily care of your teeth and gums.  Regular physical activity.  Eating a healthy diet.  Avoiding tobacco and drug use.  Limiting alcohol use.  Practicing safe sex.  Taking low doses of aspirin every day.  Taking vitamin and mineral supplements as recommended by your health care provider. What happens during an annual well check? The services and screenings done by your health care provider during your annual well check will depend on your age, overall health, lifestyle risk factors, and family history of disease. Counseling  Your health care  provider may ask you questions about your:  Alcohol use.  Tobacco use.  Drug use.  Emotional well-being.  Home and relationship well-being.  Sexual activity.  Eating habits.  History of falls.  Memory and ability to understand (cognition).  Work and work Astronomer. Screening  You may have the following tests or measurements:  Height, weight, and BMI.  Blood pressure.  Lipid and cholesterol levels. These may be checked every 5 years, or more frequently if you are over 37 years old.  Skin check.  Lung cancer screening. You may have this screening every year starting at age 96 if you have a 30-pack-year history of smoking and currently smoke or have quit within the past 15 years.  Fecal occult blood test (FOBT) of the stool. You may have this test every year starting at age 83.  Flexible sigmoidoscopy or colonoscopy. You may have a sigmoidoscopy every 5 years or a colonoscopy every 10 years starting at age 7.  Prostate cancer screening. Recommendations will vary depending on your family history and other risks.  Hepatitis C blood test.  Hepatitis B blood test.  Sexually transmitted disease (STD) testing.  Diabetes screening. This is done by checking your blood sugar (glucose) after you have not eaten for a while (fasting). You may have this done every 1-3 years.  Abdominal aortic aneurysm (AAA) screening. You may need this if you are a current or former smoker.  Osteoporosis. You may be screened starting at age 88 if you are at high risk. Talk with your health care provider about your test results, treatment options, and if necessary, the need for more tests. Vaccines  Your health care provider may recommend certain vaccines, such as:  Influenza  vaccine. This is recommended every year.  Tetanus, diphtheria, and acellular pertussis (Tdap, Td) vaccine. You may need a Td booster every 10 years.  Zoster vaccine. You may need this after age 46.  Pneumococcal  13-valent conjugate (PCV13) vaccine. One dose is recommended after age 22.  Pneumococcal polysaccharide (PPSV23) vaccine. One dose is recommended after age 2. Talk to your health care provider about which screenings and vaccines you need and how often you need them. This information is not intended to replace advice given to you by your health care provider. Make sure you discuss any questions you have with your health care provider. Document Released: 06/20/2015 Document Revised: 02/11/2016 Document Reviewed: 03/25/2015 Elsevier Interactive Patient Education  2017 [place] Prevention in the Home Falls can cause injuries. They can happen to people of all ages. There are many things you can do to make your home safe and to help prevent falls. What can I do on the outside of my home?  Regularly fix the edges of walkways and driveways and fix any cracks.  Remove anything that might make you trip as you walk through a door, such as a raised step or threshold.  Trim any bushes or trees on the path to your home.  Use bright outdoor lighting.  Clear any walking paths of anything that might make someone trip, such as rocks or tools.  Regularly check to see if handrails are loose or broken. Make sure that both sides of any steps have handrails.  Any raised decks and porches should have guardrails on the edges.  Have any leaves, snow, or ice cleared regularly.  Use sand or salt on walking paths during winter.  Clean up any spills in your garage right away. This includes oil or grease spills. What can I do in the bathroom?  Use night lights.  Install grab bars by the toilet and in the tub and shower. Do not use towel bars as grab bars.  Use non-skid mats or decals in the tub or shower.  If you need to sit down in the shower, use a plastic, non-slip stool.  Keep the floor dry. Clean up any water that spills on the floor as soon as it happens.  Remove soap buildup in the  tub or shower regularly.  Attach bath mats securely with double-sided non-slip rug tape.  Do not have throw rugs and other things on the floor that can make you trip. What can I do in the bedroom?  Use night lights.  Make sure that you have a light by your bed that is easy to reach.  Do not use any sheets or blankets that are too big for your bed. They should not hang down onto the floor.  Have a firm chair that has side arms. You can use this for support while you get dressed.  Do not have throw rugs and other things on the floor that can make you trip. What can I do in the kitchen?  Clean up any spills right away.  Avoid walking on wet floors.  Keep items that you use a lot in easy-to-reach places.  If you need to reach something above you, use a strong step stool that has a grab bar.  Keep electrical cords out of the way.  Do not use floor polish or wax that makes floors slippery. If you must use wax, use non-skid floor wax.  Do not have throw rugs and other things on the floor that can  make you trip. What can I do with my stairs?  Do not leave any items on the stairs.  Make sure that there are handrails on both sides of the stairs and use them. Fix handrails that are broken or loose. Make sure that handrails are as long as the stairways.  Check any carpeting to make sure that it is firmly attached to the stairs. Fix any carpet that is loose or worn.  Avoid having throw rugs at the top or bottom of the stairs. If you do have throw rugs, attach them to the floor with carpet tape.  Make sure that you have a light switch at the top of the stairs and the bottom of the stairs. If you do not have them, ask someone to add them for you. What else can I do to help prevent falls?  Wear shoes that:  Do not have high heels.  Have rubber bottoms.  Are comfortable and fit you well.  Are closed at the toe. Do not wear sandals.  If you use a stepladder:  Make sure that it  is fully opened. Do not climb a closed stepladder.  Make sure that both sides of the stepladder are locked into place.  Ask someone to hold it for you, if possible.  Clearly mark and make sure that you can see:  Any grab bars or handrails.  First and last steps.  Where the edge of each step is.  Use tools that help you move around (mobility aids) if they are needed. These include:  Canes.  Walkers.  Scooters.  Crutches.  Turn on the lights when you go into a dark area. Replace any light bulbs as soon as they burn out.  Set up your furniture so you have a clear path. Avoid moving your furniture around.  If any of your floors are uneven, fix them.  If there are any pets around you, be aware of where they are.  Review your medicines with your doctor. Some medicines can make you feel dizzy. This can increase your chance of falling. Ask your doctor what other things that you can do to help prevent falls. This information is not intended to replace advice given to you by your health care provider. Make sure you discuss any questions you have with your health care provider. Document Released: 03/20/2009 Document Revised: 10/30/2015 Document Reviewed: 06/28/2014 Elsevier Interactive Patient Education  2017 [employer].

## 2020-08-25 NOTE — Progress Notes (Signed)
I connected with Robert Rasmussen today by telephone and verified that I am speaking with the correct person using two identifiers. Location patient: home Location provider: work Persons participating in the virtual visit: [name], [name] LPN.   I discussed the limitations, risks, security and privacy concerns of performing an evaluation and management service by telephone and the availability of in person appointments. I also discussed with the patient that there may be a patient responsible charge related to this service. The patient expressed understanding and verbally consented to this telephonic visit.    Interactive audio and video telecommunications were attempted between this provider and patient, however failed, due to patient having technical difficulties OR patient did not have access to video capability.  We continued and completed visit with audio only.     Vital signs may be patient reported or missing.  Subjective:   Robert Rasmussen is a 70 y.o. male who presents for Medicare Annual/Subsequent preventive examination.  Review of Systems     Cardiac Risk Factors include: advanced age (>11men, >83 women);dyslipidemia;male gender;obesity (BMI >30kg/m2)     Objective:    Today's Vitals   08/25/20 1341  BP: 119/77  Pulse: 64  SpO2: 96%  Weight: (!) 350 lb (158.8 kg)  Height: 5\' 7"  (1.702 m)   Body mass index is 54.82 kg/m.  Advanced Directives 08/25/2020 09/10/2019  Does Patient Have a Medical Advance Directive? Yes Yes  Type of 11/10/2019 of [place];Living will Living will;Healthcare Power of Attorney  Copy of Healthcare Power of Attorney in Chart? No - copy requested No - copy requested    Current Medications (verified) Outpatient Encounter Medications as of 08/25/2020  Medication Sig  . atorvastatin (LIPITOR) 10 MG tablet Take 1 tablet (10 mg total) by mouth daily.  . Cholecalciferol (VITAMIN D3) 25 MCG (1000 UT) CAPS Take 1 capsule  by mouth daily.  . Multiple Vitamins-Minerals (ONE-A-DAY MENS 50+ ADVANTAGE) TABS Take 1 tablet by mouth daily.   No facility-administered encounter medications on file as of 08/25/2020.    Allergies (verified) Patient has no known allergies.   History: Past Medical History:  Diagnosis Date  . Allergy    seasonal  . Amebic dysentery   . Asthma   . Cellulitis   . GERD (gastroesophageal reflux disease)    Past Surgical History:  Procedure Laterality Date  . TONSILLECTOMY     Family History  Problem Relation Age of Onset  . Cancer Mother        breast  . Breast cancer Mother   . Cancer Father        skin  . Skin cancer Father   . Cancer Sister        breast  . Breast cancer Sister   . Heart disease Paternal Grandmother        heart attack  . Prostate cancer Neg Hx   . Colon cancer Neg Hx    Social History   Socioeconomic History  . Marital status: Divorced    Spouse name: Not on file  . Number of children: Not on file  . Years of education: Not on file  . Highest education level: Not on file  Occupational History  . Occupation: retired  Tobacco Use  . Smoking status: Never Smoker  . Smokeless tobacco: Never Used  Vaping Use  . Vaping Use: Never used  Substance and Sexual Activity  . Alcohol use: Not Currently  . Drug use: No  . Sexual activity:  Yes  Other Topics Concern  . Not on file  Social History Narrative  . Not on file   Social Determinants of Health   Financial Resource Strain: Low Risk   . Difficulty of Paying Living Expenses: Not hard at all  Food Insecurity: No Food Insecurity  . Worried About Programme researcher, broadcasting/film/videounning Out of Food in the Last Year: Never true  . Ran Out of Food in the Last Year: Never true  Transportation Needs: No Transportation Needs  . Lack of Transportation (Medical): No  . Lack of Transportation (Non-Medical): No  Physical Activity: Sufficiently Active  . Days of Exercise per Week: 3 days  . Minutes of Exercise per Session: 60 min   Stress: No Stress Concern Present  . Feeling of Stress : Not at all  Social Connections: Not on file    Tobacco Counseling Counseling given: Not Answered   Clinical Intake:  Pre-visit preparation completed: Yes  Pain : No/denies pain     Nutritional Status: BMI > 30  Obese Nutritional Risks: None Diabetes: No  How often do you need to have someone help you when you read instructions, pamphlets, or other written materials from your doctor or pharmacy?: 1 - Never What is the last grade level you completed in school?: masters degree  Diabetic? no  Interpreter Needed?: No  Information entered by :: NAllen LPN   Activities of Daily Living In your present state of health, do you have any difficulty performing the following activities: 08/25/2020 09/10/2019  Hearing? N N  Vision? N N  Comment - eyeglasses, Dr.Bell annually  Difficulty concentrating or making decisions? N N  Walking or climbing stairs? N N  Dressing or bathing? N N  Doing errands, shopping? N N  Preparing Food and eating ? N N  Using the Toilet? N N  In the past six months, have you accidently leaked urine? N N  Do you have problems with loss of bowel control? N N  Managing your Medications? N N  Managing your Finances? N N  Housekeeping or managing your Housekeeping? N N  Some recent data might be hidden    Patient Care Team: Marjie Skiffannady, Jolene T, NP as PCP - General (Nurse Practitioner)  Indicate any recent Medical Services you may have received from other than [facility] providers in the past year (date may be approximate).     Assessment:   This is a routine wellness examination for Robert Rasmussen.  Hearing/Vision screen  Hearing Screening   125Hz  250Hz  500Hz  1000Hz  2000Hz  3000Hz  4000Hz  6000Hz  8000Hz   Right ear:           Left ear:           Vision Screening Comments: Regular eye exams, Dr. Alvester MorinBell  Dietary issues and exercise activities discussed: Current Exercise Habits: Structured exercise class, Type of  exercise: calisthenics, Time (Minutes): 60, Frequency (Times/Week): 3, Weekly Exercise (Minutes/Week): 180  Goals    . Patient Stated     08/25/2020, wants to lose 25 pounds      Depression Screen PHQ 2/9 Scores 08/25/2020 09/10/2019 08/20/2019 08/01/2018 08/01/2018 07/19/2017 07/14/2016  PHQ - 2 Score 0 0 0 0 0 0 0  PHQ- 9 Score - - 2 0 0 0 -    Fall Risk Fall Risk  08/25/2020 09/10/2019 08/20/2019 08/01/2018 07/19/2017  Falls in the past year? 0 0 0 0 No  Number falls in past yr: - 0 0 0 -  Injury with Fall? - 0 0 0 -  Risk  for fall due to : Medication side effect - - - -  Follow up Falls evaluation completed;Education provided;Falls prevention discussed - - - -    FALL RISK PREVENTION PERTAINING TO THE HOME:  Any stairs in or around the home? Yes  If so, are there any without handrails? No  Home free of loose throw rugs in walkways, pet beds, electrical cords, etc? Yes  Adequate lighting in your home to reduce risk of falls? Yes   ASSISTIVE DEVICES UTILIZED TO PREVENT FALLS:  Life alert? No  Use of a cane, walker or w/c? No  Grab bars in the bathroom? Yes  Shower chair or bench in shower? Yes  Elevated toilet seat or a handicapped toilet? No   TIMED UP AND GO:  Was the test performed? No .   Cognitive Function:     6CIT Screen 08/25/2020  What Year? 0 points  What month? 0 points  What time? 0 points  Count back from 20 0 points  Months in reverse 0 points  Repeat phrase 0 points  Total Score 0    Immunizations Immunization History  Administered Date(s) Administered  . Fluad Quad(high Dose 65+) 02/26/2019, 02/28/2020  . Influenza, High Dose Seasonal PF 03/13/2018  . Influenza,inj,Quad PF,6+ Mos 03/10/2015, 07/14/2016, 07/19/2017  . PFIZER(Purple Top)SARS-COV-2 Vaccination 07/16/2019, 08/09/2019, 03/25/2020  . Pneumococcal Conjugate-13 03/13/2018  . Pneumococcal Polysaccharide-23 02/26/2019  . Td 08/23/2007  . Tdap 07/14/2016  . Zoster 03/23/2013  . Zoster  Recombinat (Shingrix) 02/12/2020    TDAP status: Up to date  Flu Vaccine status: Up to date  Pneumococcal vaccine status: Up to date  Covid-19 vaccine status: Completed vaccines  Qualifies for Shingles Vaccine? Yes   Zostavax completed Yes   Shingrix Completed?: needs second dose  Screening Tests Health Maintenance  Topic Date Due  . Fecal DNA (Cologuard)  02/01/2020  . TETANUS/TDAP  07/14/2026  . INFLUENZA VACCINE  Completed  . COVID-19 Vaccine  Completed  . Hepatitis C Screening  Completed  . PNA vac Low Risk Adult  Completed  . HPV VACCINES  Aged Out    Health Maintenance  Health Maintenance Due  Topic Date Due  . Fecal DNA (Cologuard)  02/01/2020    Colorectal cancer screening: Type of screening: FOBT/FIT. Completed 05/06/2020. Repeat every 1 years  Lung Cancer Screening: (Low Dose CT Chest recommended if Age 64-80 years, 30 pack-year currently smoking OR have quit w/in 15years.) does not qualify.   Lung Cancer Screening Referral: no  Additional Screening:  Hepatitis C Screening: does qualify; Completed 03/10/2015  Vision Screening: Recommended annual ophthalmology exams for early detection of glaucoma and other disorders of the eye. Is the patient up to date with their annual eye exam?  Yes  Who is the provider or what is the name of the office in which the patient attends annual eye exams? Dr. Alvester Morin If pt is not established with a provider, would they like to be referred to a provider to establish care? No .   Dental Screening: Recommended annual dental exams for proper oral hygiene  Community Resource Referral / Chronic Care Management: CRR required this visit?  No   CCM required this visit?  No      Plan:     I have personally reviewed and noted the following in the patient's chart:   . Medical and social history . Use of alcohol, tobacco or illicit drugs  . Current medications and supplements . Functional ability and status . Nutritional  status .  Physical activity . Advanced directives . List of other physicians . Hospitalizations, surgeries, and ER visits in previous 12 months . Vitals . Screenings to include cognitive, depression, and falls . Referrals and appointments  In addition, I have reviewed and discussed with patient certain preventive protocols, quality metrics, and best practice recommendations. A written personalized care plan for preventive services as well as general preventive health recommendations were provided to patient.     Barb Merino, LPN   0/25/4270   Nurse Notes:

## 2020-08-27 ENCOUNTER — Ambulatory Visit: Payer: Medicare HMO | Admitting: Nurse Practitioner

## 2020-08-27 DIAGNOSIS — G4733 Obstructive sleep apnea (adult) (pediatric): Secondary | ICD-10-CM | POA: Diagnosis not present

## 2020-08-29 ENCOUNTER — Ambulatory Visit: Payer: Medicare HMO

## 2020-08-29 NOTE — Progress Notes (Signed)
[facility] [address] [place], [place] [address]  Pulmonary Sleep Medicine   Office Visit Note  Patient Name: Robert Rasmussen DOB: February 10, 1951 MRN [id number]    Chief Complaint: Obstructive Sleep Apnea visit  Brief History:  [place] is seen today for follow up The patient has a 6 year history of sleep apnea and recently received a replacement machine. Patient is using PAP nightly.  The patient feels more rested after sleeping with PAP.  The patient reports benefiting from PAP use. Reported sleepiness is  improved and the Epworth Sleepiness Score is 1 out of 24. The patient does take naps. The pateint complains of the following: dry mouth  The compliance download shows excellent compliance with an average use time of 8.6 hours. The AHI is 0.3  The patient does not complain of limb movements disrupting sleep.  ROS  General: (-) fever, (-) chills, (-) night sweat Nose and Sinuses: (-) nasal stuffiness or itchiness, (-) postnasal drip, (-) nosebleeds, (-) sinus trouble. Mouth and Throat: (-) sore throat, (-) hoarseness. Neck: (-) swollen glands, (-) enlarged thyroid, (-) neck pain. Respiratory: - cough, - shortness of breath, - wheezing. Neurologic: - numbness, - tingling. Psychiatric: - anxiety, - depression   Current Medication: Outpatient Encounter Medications as of 09/01/2020  Medication Sig  . atorvastatin (LIPITOR) 10 MG tablet Take 1 tablet (10 mg total) by mouth daily.  . Cholecalciferol (VITAMIN D3) 25 MCG (1000 UT) CAPS Take 1 capsule by mouth daily.  . Multiple Vitamins-Minerals (ONE-A-DAY MENS 50+ ADVANTAGE) TABS Take 1 tablet by mouth daily.   No facility-administered encounter medications on file as of 09/01/2020.    Surgical History: Past Surgical History:  Procedure Laterality Date  . TONSILLECTOMY      Medical History: Past Medical History:  Diagnosis Date  . Allergy    seasonal  . Amebic dysentery   . Asthma   . Cellulitis   . GERD  (gastroesophageal reflux disease)     Family History: Non contributory to the present illness  Social History: Social History   Socioeconomic History  . Marital status: Divorced    Spouse name: Not on file  . Number of children: Not on file  . Years of education: Not on file  . Highest education level: Not on file  Occupational History  . Occupation: retired  Tobacco Use  . Smoking status: Never Smoker  . Smokeless tobacco: Never Used  Vaping Use  . Vaping Use: Never used  Substance and Sexual Activity  . Alcohol use: Not Currently  . Drug use: No  . Sexual activity: Yes  Other Topics Concern  . Not on file  Social History Narrative  . Not on file   Social Determinants of Health   Financial Resource Strain: Low Risk   . Difficulty of Paying Living Expenses: Not hard at all  Food Insecurity: No Food Insecurity  . Worried About Programme researcher, broadcasting/film/video in the Last Year: Never true  . Ran Out of Food in the Last Year: Never true  Transportation Needs: No Transportation Needs  . Lack of Transportation (Medical): No  . Lack of Transportation (Non-Medical): No  Physical Activity: Sufficiently Active  . Days of Exercise per Week: 3 days  . Minutes of Exercise per Session: 60 min  Stress: No Stress Concern Present  . Feeling of Stress : Not at all  Social Connections: Not on file  Intimate Partner Violence: Not on file    Vital Signs: Blood pressure 126/62,  pulse (!) 58, temperature 98.4 F (36.9 C), temperature source Temporal, resp. rate 18, height 5\' 7"  (1.702 m), weight (!) 358 lb (162.4 kg), SpO2 96 %.  Examination: General Appearance: The patient is well-developed, well-nourished, and in no distress. Neck Circumference: 49 Skin: Gross inspection of skin unremarkable. Head: normocephalic, no gross deformities. Eyes: no gross deformities noted. ENT: ears appear grossly normal Neurologic: Alert and oriented. No involuntary movements.    EPWORTH SLEEPINESS  SCALE:  Scale:  (0)= no chance of dozing; (1)= slight chance of dozing; (2)= moderate chance of dozing; (3)= high chance of dozing  Chance  Situtation    Sitting and reading: 0    Watching TV: 0    Sitting Inactive in public: 0    As a passenger in car: 0      Lying down to rest: 1    Sitting and talking: 0    Sitting quielty after lunch: 0    In a car, stopped in traffic: 0   TOTAL SCORE:   1 out of 24    SLEEP STUDIES:  1. 03/23/15 -  Split -  AHI 103.9, Low Spo2 85%, recommend APAP @ 9-12cmH2O   CPAP COMPLIANCE DATA:  Date Range: 07/30/20 - 08/28/20  Average Daily Use: 8:20 hours  Median Use: 8:48  Compliance for > 4 Hours: 97%  AHI: 0.3 respiratory events per hour  Days Used: 29/30  Mask Leak: 6.8 lpm  95th Percentile Pressure: 12.0 cmH2O         LABS: No results found for this or any previous visit (from the past 2160 hour(s)).  Radiology: DG Chest 2 View  Result Date: 06/13/2017 CLINICAL DATA:  One month of nonproductive cough worrisome for bronchitis or pneumonia. Mild exertional shortness of breath. History of childhood asthma. Nonsmoker. EXAM: CHEST  2 VIEW COMPARISON:  None in PACs FINDINGS: The lungs are well-expanded. There is no focal infiltrate. The interstitial markings are prominent especially in the lower lobes. There is pleural thickening along the lateral thoracic cage bilaterally. There is no pleural effusion. The heart is top-normal in size. The pulmonary vascularity is normal. The bony thorax exhibits no acute abnormality. IMPRESSION: There is no pneumonia nor other acute cardiopulmonary abnormality. Mild interstitial prominence likely reflects bronchitic changes either acute or chronic. Electronically Signed   By: David  08/11/2017 M.D.   On: 06/13/2017 09:55    No results found.  No results found.    Assessment and Plan: Patient Active Problem List   Diagnosis Date Noted  . OSA on CPAP 06/16/2020  . CPAP use counseling  06/16/2020  . Elevated blood pressure reading 06/16/2020  . Prediabetes 02/29/2020  . Acute pain of left shoulder 02/28/2020  . Allergic conjunctivitis 10/09/2019  . BMI 50.0-59.9, adult (HCC) 10/04/2019  . Hypernatremia 08/28/2019  . Mixed hyperlipidemia 08/01/2018  . Sciatic pain 08/01/2018  . Vitamin D deficiency 07/30/2018  . Benign hypertension 06/08/2017  . Morbid obesity (HCC) 03/10/2015  . Sleep apnea 03/10/2015      The patient does tolerate PAP and reports significant benefit from PAP use. The patient was reminded how to adjust humidity and tubing temp.  The compliance has been excellent and the apnea is controlled  2. OSA- continue excellent compliance General Counseling: I have discussed the findings of the evaluation and examination with Yusuke.  I have also discussed any further diagnostic evaluation thatmay be needed or ordered today. Rashidi verbalizes understanding of the findings of todays visit. We also reviewed his medications  today and discussed drug interactions and side effects including but not limited excessive drowsiness and altered mental states. We also discussed that there is always a risk not just to him but also people around him. he has been encouraged to call the office with any questions or concerns that should arise related to todays visit.  No orders of the defined types were placed in this encounter.       I have personally obtained a history, examined the patient, evaluated laboratory and imaging results, formulated the assessment and plan and placed orders.   Valentino Hue Sol Blazing, PhD, FAASM  Diplomate, American Board of Sleep Medicine    Yevonne Pax, MD [facility] Diplomate ABMS Pulmonary and Critical Care Medicine Sleep medicine

## 2020-09-01 ENCOUNTER — Ambulatory Visit (INDEPENDENT_AMBULATORY_CARE_PROVIDER_SITE_OTHER): Payer: Medicare HMO | Admitting: Internal Medicine

## 2020-09-01 VITALS — BP 126/62 | HR 58 | Temp 98.4°F | Resp 18 | Ht 67.0 in | Wt 358.0 lb

## 2020-09-01 DIAGNOSIS — Z7189 Other specified counseling: Secondary | ICD-10-CM

## 2020-09-01 DIAGNOSIS — Z6841 Body Mass Index (BMI) 40.0 and over, adult: Secondary | ICD-10-CM

## 2020-09-01 DIAGNOSIS — Z9989 Dependence on other enabling machines and devices: Secondary | ICD-10-CM

## 2020-09-01 DIAGNOSIS — I10 Essential (primary) hypertension: Secondary | ICD-10-CM

## 2020-09-01 DIAGNOSIS — G4733 Obstructive sleep apnea (adult) (pediatric): Secondary | ICD-10-CM

## 2020-09-01 NOTE — Patient Instructions (Signed)

## 2020-09-16 DIAGNOSIS — H524 Presbyopia: Secondary | ICD-10-CM | POA: Diagnosis not present

## 2020-09-16 DIAGNOSIS — Z0100 Encounter for examination of eyes and vision without abnormal findings: Secondary | ICD-10-CM | POA: Diagnosis not present

## 2020-09-27 DIAGNOSIS — G4733 Obstructive sleep apnea (adult) (pediatric): Secondary | ICD-10-CM | POA: Diagnosis not present

## 2020-09-29 ENCOUNTER — Ambulatory Visit: Payer: Medicare HMO | Admitting: Nurse Practitioner

## 2020-10-27 ENCOUNTER — Other Ambulatory Visit: Payer: Self-pay

## 2020-10-27 ENCOUNTER — Ambulatory Visit (INDEPENDENT_AMBULATORY_CARE_PROVIDER_SITE_OTHER): Payer: Medicare HMO | Admitting: Nurse Practitioner

## 2020-10-27 ENCOUNTER — Encounter: Payer: Self-pay | Admitting: Nurse Practitioner

## 2020-10-27 DIAGNOSIS — M25512 Pain in left shoulder: Secondary | ICD-10-CM | POA: Diagnosis not present

## 2020-10-27 DIAGNOSIS — G8929 Other chronic pain: Secondary | ICD-10-CM

## 2020-10-27 DIAGNOSIS — Z9989 Dependence on other enabling machines and devices: Secondary | ICD-10-CM

## 2020-10-27 DIAGNOSIS — I10 Essential (primary) hypertension: Secondary | ICD-10-CM | POA: Diagnosis not present

## 2020-10-27 DIAGNOSIS — Z125 Encounter for screening for malignant neoplasm of prostate: Secondary | ICD-10-CM | POA: Diagnosis not present

## 2020-10-27 DIAGNOSIS — M25551 Pain in right hip: Secondary | ICD-10-CM

## 2020-10-27 DIAGNOSIS — G4733 Obstructive sleep apnea (adult) (pediatric): Secondary | ICD-10-CM | POA: Diagnosis not present

## 2020-10-27 DIAGNOSIS — E782 Mixed hyperlipidemia: Secondary | ICD-10-CM

## 2020-10-27 DIAGNOSIS — L723 Sebaceous cyst: Secondary | ICD-10-CM

## 2020-10-27 DIAGNOSIS — R7303 Prediabetes: Secondary | ICD-10-CM | POA: Diagnosis not present

## 2020-10-27 DIAGNOSIS — Z6841 Body Mass Index (BMI) 40.0 and over, adult: Secondary | ICD-10-CM

## 2020-10-27 MED ORDER — ATORVASTATIN CALCIUM 10 MG PO TABS
10.0000 mg | ORAL_TABLET | Freq: Every day | ORAL | 4 refills | Status: DC
Start: 2020-10-27 — End: 2021-12-03

## 2020-10-27 NOTE — Assessment & Plan Note (Signed)
Chronic, ongoing, intermittent.  Suspect related to rotator cuff irritation.  Will obtain imaging to assess for OA and have baseline assessment.  Recommend switching to Tylenol at home as needed + OTC Voltaren gel.  Continue exercise classes with stretching.  Recommend return to office for worsening or ongoing -- could perform steroid injection if needed.

## 2020-10-27 NOTE — Assessment & Plan Note (Signed)
BMI 55.38.  Recommended eating smaller high protein, low fat meals more frequently and exercising 30 mins a day 5 times a week with a goal of 10-15lb weight loss in the next 3 months. Patient voiced their understanding and motivation to adhere to these recommendations.

## 2020-10-27 NOTE — Assessment & Plan Note (Signed)
Chronic, ongoing.  Continue focus on diet changes and continue Atorvastatin 10 MG daily, recheck lipid panel today and adjust medication as needed.

## 2020-10-27 NOTE — Assessment & Plan Note (Addendum)
Ongoing, present for one year with report of increased size.  At baseline has some PVD bilaterally will place order for ultrasound to ensure no DVT, his major concern.  Recommend he continue to monitor area and in future could consider soft tissue ultrasound of area.

## 2020-10-27 NOTE — Assessment & Plan Note (Signed)
Chronic, ongoing with BP at goal today without medication.  Recommend he monitor BP at least a few mornings a week at home and document.  DASH diet at home. Labs today: CMP.  Return in 6 months.

## 2020-10-27 NOTE — Assessment & Plan Note (Signed)
Chronic, with intermittent flares.  Recommend continue exercise classes and frequent stretching.  Will repeat imaging if ongoing or worsening flares.  Suspect more muscle related, he does not want to try Tizanidine at this time, but could consider for future.  Return for worsening or ongoing.

## 2020-10-27 NOTE — Patient Instructions (Signed)
Voltaren gel and Tylenol + alternate heat and ice  Shoulder Pain Many things can cause shoulder pain, including:  An injury.  Moving the shoulder in the same way again and again (overuse).  Joint pain (arthritis). Pain can come from:  Swelling and irritation (inflammation) of any part of the shoulder.  An injury to the shoulder joint.  An injury to: ? Tissues that connect muscle to bone (tendons). ? Tissues that connect bones to each other (ligaments). ? Bones. Follow these instructions at home: Watch for changes in your symptoms. Let your doctor know about them. Follow these instructions to help with your pain. If you have a sling:  Wear the sling as told by your doctor. Remove it only as told by your doctor.  Loosen the sling if your fingers: ? Tingle. ? Become numb. ? Turn cold and blue.  Keep the sling clean.  If the sling is not waterproof: ? Do not let it get wet. ? Take the sling off when you shower or bathe. Managing pain, stiffness, and swelling  If told, put ice on the painful area: ? Put ice in a plastic bag. ? Place a towel between your skin and the bag. ? Leave the ice on for 20 minutes, 2-3 times a day. Stop putting ice on if it does not help with the pain.  Squeeze a soft ball or a foam pad as much as possible. This prevents swelling in the shoulder. It also helps to strengthen the arm.   General instructions  Take over-the-counter and prescription medicines only as told by your doctor.  Keep all follow-up visits as told by your doctor. This is important. Contact a doctor if:  Your pain gets worse.  Medicine does not help your pain.  You have new pain in your arm, hand, or fingers. Get help right away if:  Your arm, hand, or fingers: ? Tingle. ? Are numb. ? Are swollen. ? Are painful. ? Turn white or blue. Summary  Shoulder pain can be caused by many things. These include injury, moving the shoulder in the same away again and again, and  joint pain.  Watch for changes in your symptoms. Let your doctor know about them.  This condition may be treated with a sling, ice, and pain medicine.  Contact your doctor if the pain gets worse or you have new pain. Get help right away if your arm, hand, or fingers tingle or get numb, swollen, or painful.  Keep all follow-up visits as told by your doctor. This is important. This information is not intended to replace advice given to you by your health care provider. Make sure you discuss any questions you have with your health care provider. Document Revised: 12/06/2017 Document Reviewed: 12/06/2017 Elsevier Patient Education  2021 [employer].

## 2020-10-27 NOTE — Assessment & Plan Note (Signed)
BMI 55.38.  Would benefit from modest weight loss. Recommended eating smaller high protein, low fat meals more frequently and exercising 30 mins a day 5 times a week with a goal of 10-15lb weight loss in the next 3 months. Patient voiced their understanding and motivation to adhere to these recommendations.

## 2020-10-27 NOTE — Assessment & Plan Note (Signed)
Chronic, ongoing.  Continue 100% use of CPAP. 

## 2020-10-27 NOTE — Assessment & Plan Note (Signed)
Noted on past labs at 5.9%.  Recheck A1c today.

## 2020-10-27 NOTE — Progress Notes (Signed)
BP 127/81   Pulse 62   Temp 99.4 F (37.4 C) (Oral)   Wt (!) 353 lb 9.6 oz (160.4 kg)   SpO2 97%   BMI 55.38 kg/m    Subjective:    Patient ID: Robert Rasmussen, male    DOB: 08/02/1950, 70 y.o.   MRN: [id number]  HPI: Robert MuffRussell E Anding is a 70 y.o. male  Chief Complaint  Patient presents with  . Hyperlipidemia    Patient is here for a follow up on his cholesterol. Patient states he is doing well and has no concerns at today's visit.   . Medication Refill    Patient is requesting refill on Lipitor prescription.  . Shoulder Pain  . Hip Pain    Patient states he is having issue with his right hip giving him issues.  . Cyst    Patient noticed a knot in his left lower leg and states it has been there for a while but has grown in size over the last six months.    HYPERLIPIDEMIA Statin, Atorvastatin 10 MG, started on 08/28/2019.  Last LDL 82.  Recent labs also noted NA+ 145, was recommended he increase water intake.  Denies any symptoms.  A1c in prediabetic range at 5.9% last check. Hyperlipidemia status: good compliance Satisfied with current treatment?  yes Side effects:  no Medication compliance: good compliance Supplements: none Aspirin:  no The 10-year ASCVD risk score Denman George(Goff DC Jr., et al., 2013) is: 12.1%   Values used to calculate the score:     Age: [age] years     Sex: Male     Is Non-Hispanic African American: No     Diabetic: No     Tobacco smoker: No     Systolic Blood Pressure: 127 mmHg     Is BP treated: No     HDL Cholesterol: 50 mg/dL     Total Cholesterol: 147 mg/dL Chest pain:  no Coronary artery disease:  no Family history CAD:  yes -- grandmother at 289 MI Family history early CAD:  no   SHOULDER PAIN To left shoulder intermittent -- chronic issue.  Three weeks ago it was causing discomfort and today is better.  Last seen for this on 02/28/20 in office.  No imaging at that visit. Duration: chronic Involved shoulder: left Mechanism of injury:  unknown Location: anterior Onset:gradual Severity: 6/10  Quality:  sharp, aching, sore and throbbing Frequency: intermittent Radiation: no Aggravating factors: lifting and movement  Alleviating factors: NSAIDs  Status: fluctuating Treatments attempted: rest, heat and ibuprofen  Relief with NSAIDs?:  moderate Weakness: no Numbness: no Decreased grip strength: no Redness: no Swelling: no Bruising: no Fevers: no   HIP PAIN Present for years, had imaging in past (no arthritis) -- right hip.   Duration: chronic Involved hip: right  Mechanism of injury: unknown Location: lateral Onset: gradual  Severity: 5/10  Quality: dull, aching and throbbing Frequency: intermittent Radiation: no Aggravating factors: walking, bending and movement   Alleviating factors: APAP, NSAIDs and rest  Status: fluctuating Treatments attempted: rest, heat and chiropractor   Relief with NSAIDs?: moderate Weakness with weight bearing: no Weakness with walking: no Paresthesias / decreased sensation: no Swelling: no Redness:no Fevers: no   SKIN CYST To lower left leg, has a pea sized cyst like mass -- this increases in size dependent on leg swelling.  Has been present for one year -- he feels it is getting bigger. Duration: months Location: left lower leg in shin area  Painful: no Itching: no Onset: gradual Context: bigger Associated signs and symptoms: bigger History of skin cancer: no History of precancerous skin lesions: no Family history of skin cancer: no  Relevant past medical, surgical, family and social history reviewed and updated as indicated. Interim medical history since our last visit reviewed. Allergies and medications reviewed and updated.  Review of Systems  Per HPI unless specifically indicated above     Objective:    BP 127/81   Pulse 62   Temp 99.4 F (37.4 C) (Oral)   Wt (!) 353 lb 9.6 oz (160.4 kg)   SpO2 97%   BMI 55.38 kg/m   Wt Readings from Last 3  Encounters:  10/27/20 (!) 353 lb 9.6 oz (160.4 kg)  09/01/20 (!) 358 lb (162.4 kg)  08/25/20 (!) 350 lb (158.8 kg)    Physical Exam Vitals and nursing note reviewed.  Constitutional:      General: He is awake. He is not in acute distress.    Appearance: He is well-developed and well-groomed. He is morbidly obese. He is not ill-appearing.  HENT:     Head: Normocephalic and atraumatic.     Right Ear: Hearing normal. No drainage.     Left Ear: Hearing normal. No drainage.  Eyes:     General: Lids are normal.     Extraocular Movements: Extraocular movements intact.     Pupils: Pupils are equal, round, and reactive to light.     Visual Fields: Right eye visual fields normal and left eye visual fields normal.  Neck:     Vascular: No carotid bruit.  Cardiovascular:     Rate and Rhythm: Normal rate and regular rhythm.     Heart sounds: Normal heart sounds, S1 normal and S2 normal. No murmur heard. No gallop.   Pulmonary:     Effort: Pulmonary effort is normal. No accessory muscle usage or respiratory distress.     Breath sounds: Normal breath sounds.  Abdominal:     General: Bowel sounds are normal.     Palpations: Abdomen is soft.  Musculoskeletal:        General: Normal range of motion.     Right shoulder: Normal.     Left shoulder: Tenderness (mild anteriorly) present. No swelling, deformity, laceration, bony tenderness or crepitus. Normal range of motion. Normal strength.     Cervical back: Normal range of motion and neck supple.     Right hip: Tenderness (mild laterally) present. No deformity, bony tenderness or crepitus. Normal range of motion. Normal strength.     Left hip: Normal.     Right lower leg: No deformity, lacerations or tenderness. 1+ Edema present.     Left lower leg: No deformity or tenderness. 1+ Edema present.       Legs:  Skin:    General: Skin is warm and dry.     Capillary Refill: Capillary refill takes less than 2 seconds.     Comments: Sparse hair  pattern lower legs with hemosiderin staining bilaterally.  Negative Homans.  Neurological:     Mental Status: He is alert and oriented to person, place, and time.  Psychiatric:        Attention and Perception: Attention normal.        Mood and Affect: Mood normal.        Speech: Speech normal.        Behavior: Behavior normal. Behavior is cooperative.        Thought Content: Thought content normal.  Results for orders placed or performed in visit on 05/14/20  Fecal occult blood, imunochemical  Result Value Ref Range   IFOBT Negative       Assessment & Plan:   Problem List Items Addressed This Visit      Cardiovascular and Mediastinum   Benign hypertension    Chronic, ongoing with BP at goal today without medication.  Recommend he monitor BP at least a few mornings a week at home and document.  DASH diet at home. Labs today: CMP.  Return in 6 months.       Relevant Medications   atorvastatin (LIPITOR) 10 MG tablet   Other Relevant Orders   Comprehensive metabolic panel   TSH     Respiratory   OSA on CPAP    Chronic, ongoing.  Continue 100% use of CPAP.        Musculoskeletal and Integument   Sebaceous cyst    Ongoing, present for one year with report of increased size.  At baseline has some PVD bilaterally will place order for ultrasound to ensure no DVT, his major concern.  Recommend he continue to monitor area and in future could consider soft tissue ultrasound of area.          Other   Morbid obesity (HCC) - Primary    BMI 55.38.  Would benefit from modest weight loss. Recommended eating smaller high protein, low fat meals more frequently and exercising 30 mins a day 5 times a week with a goal of 10-15lb weight loss in the next 3 months. Patient voiced their understanding and motivation to adhere to these recommendations.       Mixed hyperlipidemia    Chronic, ongoing.  Continue focus on diet changes and continue Atorvastatin 10 MG daily, recheck lipid panel  today and adjust medication as needed.        Relevant Medications   atorvastatin (LIPITOR) 10 MG tablet   Other Relevant Orders   Lipid Panel w/o Chol/HDL Ratio   Right hip pain    Chronic, with intermittent flares.  Recommend continue exercise classes and frequent stretching.  Will repeat imaging if ongoing or worsening flares.  Suspect more muscle related, he does not want to try Tizanidine at this time, but could consider for future.  Return for worsening or ongoing.        BMI 50.0-59.9, adult (HCC)    BMI 55.38.  Recommended eating smaller high protein, low fat meals more frequently and exercising 30 mins a day 5 times a week with a goal of 10-15lb weight loss in the next 3 months. Patient voiced their understanding and motivation to adhere to these recommendations.       Chronic left shoulder pain    Chronic, ongoing, intermittent.  Suspect related to rotator cuff irritation.  Will obtain imaging to assess for OA and have baseline assessment.  Recommend switching to Tylenol at home as needed + OTC Voltaren gel.  Continue exercise classes with stretching.  Recommend return to office for worsening or ongoing -- could perform steroid injection if needed.      Relevant Orders   DG Shoulder Left   Prediabetes    Noted on past labs at 5.9%.  Recheck A1c today.      Relevant Orders   HgB A1c    Other Visit Diagnoses    Prostate cancer screening       PSA on labs today.   Relevant Orders   PSA  Follow up plan: Return in about 6 months (around 04/29/2021) for Annual physical.

## 2020-10-28 LAB — COMPREHENSIVE METABOLIC PANEL
ALT: 21 IU/L (ref 0–44)
AST: 25 IU/L (ref 0–40)
Albumin/Globulin Ratio: 2 (ref 1.2–2.2)
Albumin: 4.6 g/dL (ref 3.8–4.8)
Alkaline Phosphatase: 58 IU/L (ref 44–121)
BUN/Creatinine Ratio: 17 (ref 10–24)
BUN: 17 mg/dL (ref 8–27)
Bilirubin Total: 0.5 mg/dL (ref 0.0–1.2)
CO2: 21 mmol/L (ref 20–29)
Calcium: 9.4 mg/dL (ref 8.6–10.2)
Chloride: 104 mmol/L (ref 96–106)
Creatinine, Ser: 1 mg/dL (ref 0.76–1.27)
Globulin, Total: 2.3 g/dL (ref 1.5–4.5)
Glucose: 104 mg/dL — ABNORMAL HIGH (ref 65–99)
Potassium: 4.5 mmol/L (ref 3.5–5.2)
Sodium: 142 mmol/L (ref 134–144)
Total Protein: 6.9 g/dL (ref 6.0–8.5)
eGFR: 82 mL/min/{1.73_m2} (ref >59)

## 2020-10-28 LAB — HEMOGLOBIN A1C
Est. average glucose Bld gHb Est-mCnc: 123 mg/dL
Hgb A1c MFr Bld: 5.9 % — ABNORMAL HIGH (ref 4.8–5.6)

## 2020-10-28 LAB — PSA: Prostate Specific Ag, Serum: 0.4 ng/mL (ref 0.0–4.0)

## 2020-10-28 LAB — LIPID PANEL W/O CHOL/HDL RATIO
Cholesterol, Total: 142 mg/dL (ref 100–199)
HDL: 49 mg/dL (ref >39)
LDL Chol Calc (NIH): 80 mg/dL (ref 0–99)
Triglycerides: 62 mg/dL (ref 0–149)
VLDL Cholesterol Cal: 13 mg/dL (ref 5–40)

## 2020-10-28 LAB — TSH: TSH: 2.37 u[IU]/mL (ref 0.450–4.500)

## 2020-10-28 NOTE — Progress Notes (Signed)
Contacted via MyChart   Good evening Mr. Spellman, your labs have returned: - Kidney function, creatinine and eGFR, is normal. Liver function, AST and ALT, also normal. - Glucose a little elevated, but A1c remains in prediabetic range.  The A1C is the diabetes testing we talked about, this looks at your blood sugars over the past 3 months and turns the average into a number.  Your number is 5.9%, meaning you are prediabetic.  Any number 5.7 to 6.4 is considered prediabetes and any number 6.5 or greater is considered diabetes.   I would recommend heavy focus on decreasing foods high in sugar and your intake of things like bread products, pasta, and rice.  The American Diabetes Association online has a large amount of information on diet changes to make.  We will recheck this number in 3 to 6 months to ensure you are not continuing to trend upwards and move into diabetes.   - Cholesterol levels continue to look great on current medication regimen, no changes needed. - Thyroid lab is normal.   - Prostate lab remains normal.  Any questions? Keep being awesome!!  Thank you for allowing me to participate in your care.  I appreciate you. Kindest regards, Ryna Beckstrom

## 2020-11-27 DIAGNOSIS — G4733 Obstructive sleep apnea (adult) (pediatric): Secondary | ICD-10-CM | POA: Diagnosis not present

## 2020-12-27 DIAGNOSIS — G4733 Obstructive sleep apnea (adult) (pediatric): Secondary | ICD-10-CM | POA: Diagnosis not present

## 2021-01-20 DIAGNOSIS — G4733 Obstructive sleep apnea (adult) (pediatric): Secondary | ICD-10-CM | POA: Diagnosis not present

## 2021-01-27 DIAGNOSIS — G4733 Obstructive sleep apnea (adult) (pediatric): Secondary | ICD-10-CM | POA: Diagnosis not present

## 2021-02-27 DIAGNOSIS — G4733 Obstructive sleep apnea (adult) (pediatric): Secondary | ICD-10-CM | POA: Diagnosis not present

## 2021-03-29 DIAGNOSIS — G4733 Obstructive sleep apnea (adult) (pediatric): Secondary | ICD-10-CM | POA: Diagnosis not present

## 2021-04-29 DIAGNOSIS — G4733 Obstructive sleep apnea (adult) (pediatric): Secondary | ICD-10-CM | POA: Diagnosis not present

## 2021-05-04 ENCOUNTER — Encounter: Payer: Medicare HMO | Admitting: Nurse Practitioner

## 2021-05-29 DIAGNOSIS — G4733 Obstructive sleep apnea (adult) (pediatric): Secondary | ICD-10-CM | POA: Diagnosis not present

## 2021-06-25 ENCOUNTER — Ambulatory Visit (INDEPENDENT_AMBULATORY_CARE_PROVIDER_SITE_OTHER): Payer: Medicare HMO | Admitting: Nurse Practitioner

## 2021-06-25 ENCOUNTER — Other Ambulatory Visit: Payer: Self-pay

## 2021-06-25 ENCOUNTER — Encounter: Payer: Self-pay | Admitting: Nurse Practitioner

## 2021-06-25 DIAGNOSIS — Z6841 Body Mass Index (BMI) 40.0 and over, adult: Secondary | ICD-10-CM

## 2021-06-25 DIAGNOSIS — Z23 Encounter for immunization: Secondary | ICD-10-CM

## 2021-06-25 DIAGNOSIS — M545 Low back pain, unspecified: Secondary | ICD-10-CM | POA: Insufficient documentation

## 2021-06-25 DIAGNOSIS — G4733 Obstructive sleep apnea (adult) (pediatric): Secondary | ICD-10-CM

## 2021-06-25 DIAGNOSIS — G8929 Other chronic pain: Secondary | ICD-10-CM

## 2021-06-25 DIAGNOSIS — I10 Essential (primary) hypertension: Secondary | ICD-10-CM | POA: Diagnosis not present

## 2021-06-25 DIAGNOSIS — Z9989 Dependence on other enabling machines and devices: Secondary | ICD-10-CM

## 2021-06-25 DIAGNOSIS — Z0000 Encounter for general adult medical examination without abnormal findings: Secondary | ICD-10-CM | POA: Diagnosis not present

## 2021-06-25 DIAGNOSIS — E559 Vitamin D deficiency, unspecified: Secondary | ICD-10-CM

## 2021-06-25 DIAGNOSIS — R7303 Prediabetes: Secondary | ICD-10-CM | POA: Diagnosis not present

## 2021-06-25 DIAGNOSIS — M5442 Lumbago with sciatica, left side: Secondary | ICD-10-CM

## 2021-06-25 DIAGNOSIS — E782 Mixed hyperlipidemia: Secondary | ICD-10-CM | POA: Diagnosis not present

## 2021-06-25 DIAGNOSIS — N400 Enlarged prostate without lower urinary tract symptoms: Secondary | ICD-10-CM

## 2021-06-25 DIAGNOSIS — M5441 Lumbago with sciatica, right side: Secondary | ICD-10-CM

## 2021-06-25 LAB — BAYER DCA HB A1C WAIVED: HB A1C (BAYER DCA - WAIVED): 5.8 % — ABNORMAL HIGH (ref 4.8–5.6)

## 2021-06-25 LAB — MICROALBUMIN, URINE WAIVED
Creatinine, Urine Waived: 50 mg/dL (ref 10–300)
Microalb, Ur Waived: 10 mg/L (ref 0–19)
Microalb/Creat Ratio: <30 mg/g (ref <30)

## 2021-06-25 MED ORDER — SHINGRIX 50 MCG/0.5ML IM SUSR: 0.5000 mL | Freq: Once | INTRAMUSCULAR | 0 refills | Status: AC

## 2021-06-25 MED ORDER — MELOXICAM 15 MG PO TABS: 15.0000 mg | ORAL_TABLET | Freq: Every day | ORAL | 3 refills | Status: DC

## 2021-06-25 NOTE — Assessment & Plan Note (Signed)
Chronic, ongoing with BP at goal today without medication on recheck today and at goal at home.  Suspect some white coat present.  Recommend he monitor BP at least a few mornings a week at home and document.  DASH diet at home. Labs today: CMP, CBC, TSH, urine ALB.  Return in 6 months.

## 2021-06-25 NOTE — Assessment & Plan Note (Signed)
Ongoing for 6 weeks, suspect some underlying DDD present.  At this time he prefers to avoid steroid.  Start Meloxicam 15 MG daily as needed for discomfort and recommend OTC Icy/Hot Lidocaine patches for pain.  Provided him with paper showing stretches to perform at home.  Return to office for worsening or ongoing -- will consider imaging if ongoing.

## 2021-06-25 NOTE — Assessment & Plan Note (Signed)
BMI 56.37.  Would benefit from modest weight loss. Recommended eating smaller high protein, low fat meals more frequently and exercising 30 mins a day 5 times a week with a goal of 10-15lb weight loss in the next 3 months. Patient voiced their understanding and motivation to adhere to these recommendations.

## 2021-06-25 NOTE — Assessment & Plan Note (Signed)
Chronic, ongoing.  Continue focus on diet changes and continue Atorvastatin 10 MG daily, recheck lipid panel today and adjust medication as needed.   

## 2021-06-25 NOTE — Assessment & Plan Note (Signed)
Chronic, ongoing.  Continue 100% use of CPAP. 

## 2021-06-25 NOTE — Assessment & Plan Note (Signed)
Noted on past labs at 5.9% = today is 5.8%.  Continue diet focus at home.

## 2021-06-25 NOTE — Assessment & Plan Note (Signed)
Chronic, ongoing, continue supplement daily and recheck level today. 

## 2021-06-25 NOTE — Progress Notes (Signed)
BP 130/82 (BP Location: Left Arm)    Pulse 60    Temp 98.1 F (36.7 C) (Oral)    Ht 5' 6.75" (1.695 m)    Wt (!) 357 lb 3.2 oz (162 kg)    SpO2 96%    BMI 56.37 kg/m    Subjective:    Patient ID: Robert Rasmussen, male    DOB: Sep 26, 1950, 71 y.o.   MRN: [id number]  HPI: Robert Rasmussen is a 71 y.o. male presenting on 06/25/2021 for comprehensive medical examination. Current medical complaints include:none  He currently lives with: self Interim Problems from his last visit: no  BP at home on checks is often low 130's/70-80, checks at home once a week.  HYPERLIPIDEMIA Currently taking Atorvastatin daily.  Uses CPAP at home 100% of the time. Hyperlipidemia status: good compliance Satisfied with current treatment?  yes Side effects:  no Medication compliance: good compliance Past cholesterol meds: none Supplements: none Aspirin:  no The 10-year ASCVD risk score (Arnett DK, et al., 2019) is: 13.5%   Values used to calculate the score:     Age: 60 years     Sex: Male     Is Non-Hispanic African American: No     Diabetic: No     Tobacco smoker: No     Systolic Blood Pressure: [id number] mmHg     Is BP treated: No     HDL Cholesterol: 49 mg/dL     Total Cholesterol: 142 mg/dL Chest pain:  no Coronary artery disease:  no Family history CAD:  yes -- paternal grandmother Family history early CAD:  no   PREDIABETES History of elevation in A1c with recent 5.9% on 10/27/20. Polydipsia/polyuria: no Visual disturbance: no Chest pain: no Paresthesias: no  BACK PAIN Has taken up bird watching and is on feet a lot recently, getting up in morning it is miserable -- shooting pains down both thighs. Duration: weeks Mechanism of injury: unknown Location: bilateral and low back Onset: gradual Severity: 4-5/10 Quality: sharp, shooting, aching Frequency: intermittent -- constant in the morning Radiation: buttocks Aggravating factors:  prolonged standing, movement, and bending Alleviating factors:   ASA or Advil Status: fluctuating Treatments attempted: ASA and Advil Relief with NSAIDs?: moderate Nighttime pain:  no Paresthesias / decreased sensation:  no Bowel / bladder incontinence:  no Fevers:  no Dysuria / urinary frequency:  no   Functional Status Survey: Is the patient deaf or have difficulty hearing?: No Does the patient have difficulty seeing, even when wearing glasses/contacts?: No Does the patient have difficulty concentrating, remembering, or making decisions?: No Does the patient have difficulty walking or climbing stairs?: No Does the patient have difficulty dressing or bathing?: No Does the patient have difficulty doing errands alone such as visiting a doctor's office or shopping?: No  FALL RISK: Fall Risk  06/25/2021 08/25/2020 09/10/2019 08/20/2019 08/01/2018  Falls in the past year? 0 0 0 0 0  Number falls in past yr: 0 - 0 0 0  Injury with Fall? 0 - 0 0 0  Risk for fall due to : No Fall Risks Medication side effect - - -  Follow up Falls evaluation completed Falls evaluation completed;Education provided;Falls prevention discussed - - -   Depression Screen Depression screen [facility] 2/9 06/25/2021 08/25/2020 09/10/2019 08/20/2019 08/01/2018  Decreased Interest 0 0 0 0 0  Down, Depressed, Hopeless 0 0 0 0 0  PHQ - 2 Score 0 0 0 0 0  Altered sleeping 0 - - 0  0  Tired, decreased energy 0 - - 0 0  Change in appetite 1 - - 2 0  Feeling bad or failure about yourself  0 - - 0 0  Trouble concentrating 0 - - 0 0  Moving slowly or fidgety/restless 0 - - 0 0  Suicidal thoughts - - - 0 0  PHQ-9 Score 1 - - 2 0  Difficult doing work/chores Not difficult at all - - - -    Advanced Directives <no information>  Past Medical History:  Past Medical History:  Diagnosis Date   Allergy    seasonal   Amebic dysentery    Asthma    Cellulitis    GERD (gastroesophageal reflux disease)    Hyperlipidemia     Surgical History:  Past Surgical History:  Procedure Laterality Date    TONSILLECTOMY      Medications:  Current Outpatient Medications on File Prior to Visit  Medication Sig   atorvastatin (LIPITOR) 10 MG tablet Take 1 tablet (10 mg total) by mouth daily.   Cholecalciferol (VITAMIN D3) 25 MCG (1000 UT) CAPS Take 1 capsule by mouth daily.   Multiple Vitamins-Minerals (ONE-A-DAY MENS 50+ ADVANTAGE) TABS Take 1 tablet by mouth daily.   No current facility-administered medications on file prior to visit.    Allergies:  No Known Allergies  Social History:  Social History   Socioeconomic History   Marital status: Divorced    Spouse name: Not on file   Number of children: Not on file   Years of education: Not on file   Highest education level: Not on file  Occupational History   Occupation: retired  Tobacco Use   Smoking status: Never   Smokeless tobacco: Never  Vaping Use   Vaping Use: Never used  Substance and Sexual Activity   Alcohol use: Not Currently   Drug use: No   Sexual activity: Yes  Other Topics Concern   Not on file  Social History Narrative   Not on file   Social Determinants of Health   Financial Resource Strain: Low Risk    Difficulty of Paying Living Expenses: Not hard at all  Food Insecurity: No Food Insecurity   Worried About Charity fundraiser in the Last Year: Never true   [place] in the Last Year: Never true  Transportation Needs: No Transportation Needs   Lack of Transportation (Medical): No   Lack of Transportation (Non-Medical): No  Physical Activity: Sufficiently Active   Days of Exercise per Week: 3 days   Minutes of Exercise per Session: 60 min  Stress: No Stress Concern Present   Feeling of Stress : Not at all  Social Connections: Socially Isolated   Frequency of Communication with Friends and Family: More than three times a week   Frequency of Social Gatherings with Friends and Family: More than three times a week   Attends Religious Services: Never   Marine scientist or Organizations: No    Attends Music therapist: Never   Marital Status: Never married  Human resources officer Violence: Not At Risk   Fear of Current or Ex-Partner: No   Emotionally Abused: No   Physically Abused: No   Sexually Abused: No   Social History   Tobacco Use  Smoking Status Never  Smokeless Tobacco Never   Social History   Substance and Sexual Activity  Alcohol Use Not Currently    Family History:  Family History  Problem Relation Age of Onset  Cancer Mother        breast   Breast cancer Mother    Cancer Father        skin   Skin cancer Father    Cancer Sister        breast   Breast cancer Sister    Heart disease Paternal Grandmother        heart attack   Prostate cancer Neg Hx    Colon cancer Neg Hx     Past medical history, surgical history, medications, allergies, family history and social history reviewed with patient today and changes made to appropriate areas of the chart.   ROS All other ROS negative except what is listed above and in the HPI.      Objective:    BP 130/82 (BP Location: Left Arm)    Pulse 60    Temp 98.1 F (36.7 C) (Oral)    Ht 5' 6.75" (1.695 m)    Wt (!) 357 lb 3.2 oz (162 kg)    SpO2 96%    BMI 56.37 kg/m   Wt Readings from Last 3 Encounters:  06/25/21 (!) 357 lb 3.2 oz (162 kg)  10/27/20 (!) 353 lb 9.6 oz (160.4 kg)  09/01/20 (!) 358 lb (162.4 kg)    Physical Exam Vitals and nursing note reviewed.  Constitutional:      General: He is awake. He is not in acute distress.    Appearance: He is well-developed and well-groomed. He is morbidly obese. He is not ill-appearing or toxic-appearing.  HENT:     Head: Normocephalic and atraumatic.     Right Ear: Hearing, tympanic membrane, ear canal and external ear normal. No drainage.     Left Ear: Hearing, tympanic membrane, ear canal and external ear normal. No drainage.     Nose: Nose normal.     Mouth/Throat:     Pharynx: Uvula midline.  Eyes:     General: Lids are normal.         Right eye: No discharge.        Left eye: No discharge.     Extraocular Movements: Extraocular movements intact.     Conjunctiva/sclera: Conjunctivae normal.     Pupils: Pupils are equal, round, and reactive to light.     Visual Fields: Right eye visual fields normal and left eye visual fields normal.  Neck:     Thyroid: No thyromegaly.     Vascular: No carotid bruit or JVD.     Trachea: Trachea normal.  Cardiovascular:     Rate and Rhythm: Normal rate and regular rhythm.     Heart sounds: Normal heart sounds, S1 normal and S2 normal. No murmur heard.   No gallop.  Pulmonary:     Effort: Pulmonary effort is normal. No accessory muscle usage or respiratory distress.     Breath sounds: Normal breath sounds.  Abdominal:     General: Bowel sounds are normal.     Palpations: Abdomen is soft. There is no hepatomegaly or splenomegaly.     Tenderness: There is no abdominal tenderness.  Musculoskeletal:        General: Normal range of motion.     Cervical back: Normal range of motion and neck supple.     Right lower leg: No edema.     Left lower leg: No edema.  Lymphadenopathy:     Head:     Right side of head: No submental, submandibular, tonsillar, preauricular or posterior auricular adenopathy.  Left side of head: No submental, submandibular, tonsillar, preauricular or posterior auricular adenopathy.     Cervical: No cervical adenopathy.  Skin:    General: Skin is warm and dry.     Capillary Refill: Capillary refill takes less than 2 seconds.     Findings: No rash.  Neurological:     Mental Status: He is alert and oriented to person, place, and time.     Gait: Gait is intact.     Deep Tendon Reflexes: Reflexes are normal and symmetric.     Reflex Scores:      Brachioradialis reflexes are 2+ on the right side and 2+ on the left side.      Patellar reflexes are 2+ on the right side and 2+ on the left side. Psychiatric:        Attention and Perception: Attention normal.         Mood and Affect: Mood normal.        Speech: Speech normal.        Behavior: Behavior normal. Behavior is cooperative.        Thought Content: Thought content normal.        Cognition and Memory: Cognition normal.    6CIT Screen 08/25/2020  What Year? 0 points  What month? 0 points  What time? 0 points  Count back from 20 0 points  Months in reverse 0 points  Repeat phrase 0 points  Total Score 0   Results for orders placed or performed in visit on 11/18/20  Cologuard  Result Value Ref Range   Cologuard Negative Negative      Assessment & Plan:   Problem List Items Addressed This Visit       Cardiovascular and Mediastinum   Benign hypertension    Chronic, ongoing with BP at goal today without medication on recheck today and at goal at home.  Suspect some white coat present.  Recommend he monitor BP at least a few mornings a week at home and document.  DASH diet at home. Labs today: CMP, CBC, TSH, urine ALB.  Return in 6 months.       Relevant Orders   CBC with Differential/Platelet   Comprehensive metabolic panel   Microalbumin, Urine Waived   TSH     Respiratory   OSA on CPAP    Chronic, ongoing.  Continue 100% use of CPAP.        Other   BMI 50.0-59.9, adult [facility])    Refer to morbid obesity plan of care.       Lower back pain    Ongoing for 6 weeks, suspect some underlying DDD present.  At this time he prefers to avoid steroid.  Start Meloxicam 15 MG daily as needed for discomfort and recommend OTC Icy/Hot Lidocaine patches for pain.  Provided him with paper showing stretches to perform at home.  Return to office for worsening or ongoing -- will consider imaging if ongoing.      Relevant Medications   meloxicam (MOBIC) 15 MG tablet   Mixed hyperlipidemia    Chronic, ongoing.  Continue focus on diet changes and continue Atorvastatin 10 MG daily, recheck lipid panel today and adjust medication as needed.        Relevant Orders   Comprehensive metabolic  panel   Lipid Panel w/o Chol/HDL Ratio   Morbid obesity ([place]) - Primary    BMI 56.37.  Would benefit from modest weight loss. Recommended eating smaller high protein, low fat meals more  frequently and exercising 30 mins a day 5 times a week with a goal of 10-15lb weight loss in the next 3 months. Patient voiced their understanding and motivation to adhere to these recommendations.       Prediabetes    Noted on past labs at 5.9% = today is 5.8%.  Continue diet focus at home.      Relevant Orders   Bayer DCA Hb A1c Waived   Microalbumin, Urine Waived   Vitamin D deficiency    Chronic, ongoing, continue supplement daily and recheck level today.      Relevant Orders   VITAMIN D 25 Hydroxy (Vit-D Deficiency, Fractures)   Other Visit Diagnoses     Benign prostatic hyperplasia without lower urinary tract symptoms       PSA on labs today.   Relevant Orders   PSA   Encounter for annual physical exam       Annual physical with labs today and health maintenance reviewed with patient.       Discussed aspirin prophylaxis for myocardial infarction prevention and decision was it was not indicated  LABORATORY TESTING:  Health maintenance labs ordered today as discussed above.   The natural history of prostate cancer and ongoing controversy regarding screening and potential treatment outcomes of prostate cancer has been discussed with the patient. The meaning of a false positive PSA and a false negative PSA has been discussed. He indicates understanding of the limitations of this screening test and wishes to proceed with screening PSA testing.   IMMUNIZATIONS:   - Tdap: Tetanus vaccination status reviewed: last tetanus booster within 10 years. - Influenza: Administered today - Pneumovax: Up to date - Prevnar: Up to date - Zostavax vaccine:  needs one more  SCREENING: - Colonoscopy: Up to date -- Cologuard Discussed with patient purpose of the colonoscopy is to detect colon cancer at  curable precancerous or early stages   - AAA Screening: Not applicable  -Hearing Test: Not applicable  -Spirometry: Not applicable   PATIENT COUNSELING:    Sexuality: Discussed sexually transmitted diseases, partner selection, use of condoms, avoidance of unintended pregnancy  and contraceptive alternatives.   Advised to avoid cigarette smoking.  I discussed with the patient that most people either abstain from alcohol or drink within safe limits (<=14/week and <=4 drinks/occasion for males, <=7/weeks and <= 3 drinks/occasion for females) and that the risk for alcohol disorders and other health effects rises proportionally with the number of drinks per week and how often a drinker exceeds daily limits.  Discussed cessation/primary prevention of drug use and availability of treatment for abuse.   Diet: Encouraged to adjust caloric intake to maintain  or achieve ideal body weight, to reduce intake of dietary saturated fat and total fat, to limit sodium intake by avoiding high sodium foods and not adding table salt, and to maintain adequate dietary potassium and calcium preferably from fresh fruits, vegetables, and low-fat dairy products.    Stressed the importance of regular exercise  Injury prevention: Discussed safety belts, safety helmets, smoke detector, smoking near bedding or upholstery.   Dental health: Discussed importance of regular tooth brushing, flossing, and dental visits.   Follow up plan: NEXT PREVENTATIVE PHYSICAL DUE IN 1 YEAR. Return in about 6 months (around 12/23/2021) for HLD and PREDIABETES.

## 2021-06-25 NOTE — Patient Instructions (Signed)

## 2021-06-25 NOTE — Assessment & Plan Note (Signed)
Refer to morbid obesity plan of care. 

## 2021-06-26 LAB — LIPID PANEL W/O CHOL/HDL RATIO
Cholesterol, Total: 145 mg/dL (ref 100–199)
HDL: 55 mg/dL (ref >39)
LDL Chol Calc (NIH): 80 mg/dL (ref 0–99)
Triglycerides: 47 mg/dL (ref 0–149)
VLDL Cholesterol Cal: 10 mg/dL (ref 5–40)

## 2021-06-26 LAB — COMPREHENSIVE METABOLIC PANEL
ALT: 24 IU/L (ref 0–44)
AST: 27 IU/L (ref 0–40)
Albumin/Globulin Ratio: 1.8 (ref 1.2–2.2)
Albumin: 4.2 g/dL (ref 3.8–4.8)
Alkaline Phosphatase: 54 IU/L (ref 44–121)
BUN/Creatinine Ratio: 16 (ref 10–24)
BUN: 18 mg/dL (ref 8–27)
Bilirubin Total: 0.4 mg/dL (ref 0.0–1.2)
CO2: 20 mmol/L (ref 20–29)
Calcium: 9.3 mg/dL (ref 8.6–10.2)
Chloride: 106 mmol/L (ref 96–106)
Creatinine, Ser: 1.14 mg/dL (ref 0.76–1.27)
Globulin, Total: 2.4 g/dL (ref 1.5–4.5)
Glucose: 109 mg/dL — ABNORMAL HIGH (ref 70–99)
Potassium: 5 mmol/L (ref 3.5–5.2)
Sodium: 141 mmol/L (ref 134–144)
Total Protein: 6.6 g/dL (ref 6.0–8.5)
eGFR: 70 mL/min/{1.73_m2} (ref >59)

## 2021-06-26 LAB — CBC WITH DIFFERENTIAL/PLATELET
Basophils Absolute: 0.1 10*3/uL (ref 0.0–0.2)
Basos: 1 %
EOS (ABSOLUTE): 0.2 10*3/uL (ref 0.0–0.4)
Eos: 2 %
Hematocrit: 42.6 % (ref 37.5–51.0)
Hemoglobin: 15 g/dL (ref 13.0–17.7)
Immature Grans (Abs): 0 10*3/uL (ref 0.0–0.1)
Immature Granulocytes: 1 %
Lymphocytes Absolute: 1.9 10*3/uL (ref 0.7–3.1)
Lymphs: 27 %
MCH: 33.1 pg — ABNORMAL HIGH (ref 26.6–33.0)
MCHC: 35.2 g/dL (ref 31.5–35.7)
MCV: 94 fL (ref 79–97)
Monocytes Absolute: 0.6 10*3/uL (ref 0.1–0.9)
Monocytes: 10 %
Neutrophils Absolute: 4 10*3/uL (ref 1.4–7.0)
Neutrophils: 59 %
Platelets: 196 10*3/uL (ref 150–450)
RBC: 4.53 x10E6/uL (ref 4.14–5.80)
RDW: 11.8 % (ref 11.6–15.4)
WBC: 6.8 10*3/uL (ref 3.4–10.8)

## 2021-06-26 LAB — TSH: TSH: 2.14 u[IU]/mL (ref 0.450–4.500)

## 2021-06-26 LAB — PSA: Prostate Specific Ag, Serum: 0.4 ng/mL (ref 0.0–4.0)

## 2021-06-26 LAB — VITAMIN D 25 HYDROXY (VIT D DEFICIENCY, FRACTURES): Vit D, 25-Hydroxy: 33.8 ng/mL (ref 30.0–100.0)

## 2021-06-26 NOTE — Progress Notes (Signed)
Contacted via MyChart ° ° °Good morning Robert Rasmussen, waiting on a couple more labs today return, but will go over current ones: °- CBC shows no anemia or infection °- Kidney function, creatinine and eGFR, is normal, as is liver function, AST and ALT. °- Cholesterol levels are normal and at goal.  Continue Atorvastatin. °- Vitamin D level normal, continue supplement. °Waiting on thyroid and prostate labs and will alert you if any concerns on these. Any questions? °Keep being amazing!!  Thank you for allowing me to participate in your care.  I appreciate you. °Kindest regards, °Jolene °

## 2021-06-26 NOTE — Progress Notes (Signed)
Contacted via MyChart   Prostate and thyroid labs normal:)

## 2021-06-29 DIAGNOSIS — G4733 Obstructive sleep apnea (adult) (pediatric): Secondary | ICD-10-CM | POA: Diagnosis not present

## 2021-07-23 DIAGNOSIS — G4733 Obstructive sleep apnea (adult) (pediatric): Secondary | ICD-10-CM | POA: Diagnosis not present

## 2021-07-30 DIAGNOSIS — G4733 Obstructive sleep apnea (adult) (pediatric): Secondary | ICD-10-CM | POA: Diagnosis not present

## 2021-08-27 ENCOUNTER — Ambulatory Visit: Payer: Medicare HMO

## 2021-08-28 ENCOUNTER — Ambulatory Visit: Payer: Medicare HMO

## 2021-08-31 ENCOUNTER — Ambulatory Visit: Payer: Medicare HMO | Admitting: Internal Medicine

## 2021-08-31 NOTE — Progress Notes (Signed)
Pt did not show for scheduled appointment due to illness.

## 2021-09-08 ENCOUNTER — Ambulatory Visit (INDEPENDENT_AMBULATORY_CARE_PROVIDER_SITE_OTHER): Payer: Medicare HMO | Admitting: *Deleted

## 2021-09-08 DIAGNOSIS — Z0000 Encounter for general adult medical examination without abnormal findings: Secondary | ICD-10-CM | POA: Diagnosis not present

## 2021-09-08 NOTE — Progress Notes (Signed)
? ?Subjective:  ? Robert Rasmussen is a 71 y.o. male who presents for Medicare Annual/Subsequent preventive examination. ? ?I connected with  Charmian Muff on 09/08/21 by a telephone enabled telemedicine application and verified that I am speaking with the correct person using two identifiers. ?  ?I discussed the limitations of evaluation and management by telemedicine. The patient expressed understanding and agreed to proceed. ? ?Patient location: home ? ?Provider location: Tele-Health  not in office ? ? ? ?Review of Systems    ? ?Cardiac Risk Factors include: advanced age (>71men, >76 women);obesity (BMI >30kg/m2);male gender;hypertension ? ?   ?Objective:  ?  ?Today's Vitals  ? ?There is no height or weight on file to calculate BMI. ? ? ?  09/08/2021  ? 10:32 AM 08/25/2020  ?  1:45 PM 09/10/2019  ?  2:22 PM  ?Advanced Directives  ?Does Patient Have a Medical Advance Directive? Yes Yes Yes  ?Type of Estate agent of [employer] Power of [place];Living will Living will;Healthcare Power of Attorney  ?Copy of Healthcare Power of Attorney in Chart? No - copy requested No - copy requested No - copy requested  ? ? ?Current Medications (verified) ?Outpatient Encounter Medications as of 09/08/2021  ?Medication Sig  ? atorvastatin (LIPITOR) 10 MG tablet Take 1 tablet (10 mg total) by mouth daily.  ? Cholecalciferol (VITAMIN D3) 25 MCG (1000 UT) CAPS Take 1 capsule by mouth daily.  ? Multiple Vitamins-Minerals (ONE-A-DAY MENS 50+ ADVANTAGE) TABS Take 1 tablet by mouth daily.  ? meloxicam (MOBIC) 15 MG tablet Take 1 tablet (15 mg total) by mouth daily. (Patient not taking: Reported on 09/08/2021)  ? ?No facility-administered encounter medications on file as of 09/08/2021.  ? ? ?Allergies (verified) ?Patient has no known allergies.  ? ?History: ?Past Medical History:  ?Diagnosis Date  ? Allergy   ? seasonal  ? Amebic dysentery   ? Asthma   ? Cellulitis   ? GERD (gastroesophageal reflux disease)   ?  Hyperlipidemia   ? ?Past Surgical History:  ?Procedure Laterality Date  ? TONSILLECTOMY    ? ?Family History  ?Problem Relation Age of Onset  ? Cancer Mother   ?     breast  ? Breast cancer Mother   ? Cancer Father   ?     skin  ? Skin cancer Father   ? Cancer Sister   ?     breast  ? Breast cancer Sister   ? Heart disease Paternal Grandmother   ?     heart attack  ? Prostate cancer Neg Hx   ? Colon cancer Neg Hx   ? ?Social History  ? ?Socioeconomic History  ? Marital status: Divorced  ?  Spouse name: Not on file  ? Number of children: Not on file  ? Years of education: Not on file  ? Highest education level: Not on file  ?Occupational History  ? Occupation: retired  ?Tobacco Use  ? Smoking status: Never  ? Smokeless tobacco: Never  ?Vaping Use  ? Vaping Use: Never used  ?Substance and Sexual Activity  ? Alcohol use: Not Currently  ? Drug use: No  ? Sexual activity: Yes  ?Other Topics Concern  ? Not on file  ?Social History Narrative  ? Not on file  ? ?Social Determinants of Health  ? ?Financial Resource Strain: Low Risk   ? Difficulty of Paying Living Expenses: Not hard at all  ?Food Insecurity: No Food Insecurity  ? Worried  About Running Out of Food in the Last Year: Never true  ? Ran Out of Food in the Last Year: Never true  ?Transportation Needs: No Transportation Needs  ? Lack of Transportation (Medical): No  ? Lack of Transportation (Non-Medical): No  ?Physical Activity: Insufficiently Active  ? Days of Exercise per Week: 3 days  ? Minutes of Exercise per Session: 30 min  ?Stress: No Stress Concern Present  ? Feeling of Stress : Not at all  ?Social Connections: Moderately Isolated  ? Frequency of Communication with Friends and Family: More than three times a week  ? Frequency of Social Gatherings with Friends and Family: Three times a week  ? Attends Religious Services: Never  ? Active Member of Clubs or Organizations: Yes  ? Attends Banker Meetings: More than 4 times per year  ? Marital Status:  Divorced  ? ? ?Tobacco Counseling ?Counseling given: Not Answered ? ? ?Clinical Intake: ? ?Pre-visit preparation completed: Yes ? ?Pain : No/denies pain ? ?  ? ?Nutritional Risks: None ?Diabetes: No ? ?How often do you need to have someone help you when you read instructions, pamphlets, or other written materials from your doctor or pharmacy?: 1 - Never ? ?Diabetic?no ? ?Interpreter Needed?: No ? ?Information entered by :: Remi Haggard LPN ? ? ?Activities of Daily Living ? ?  09/08/2021  ? 10:35 AM 06/25/2021  ? 10:41 AM  ?In your present state of health, do you have any difficulty performing the following activities:  ?Hearing? 0 0  ?Vision? 0 0  ?Difficulty concentrating or making decisions? 0 0  ?Walking or climbing stairs? 0 0  ?Dressing or bathing? 0 0  ?Doing errands, shopping? 0 0  ?Preparing Food and eating ? N   ?Using the Toilet? N   ?In the past six months, have you accidently leaked urine? N   ?Do you have problems with loss of bowel control? N   ?Managing your Medications? N   ?Managing your Finances? N   ?Housekeeping or managing your Housekeeping? N   ? ? ?Patient Care Team: ?Marjie Skiff, NP as PCP - General (Nurse Practitioner) ? ?Indicate any recent Medical Services you may have received from other than [facility] providers in the past year (date may be approximate). ? ?   ?Assessment:  ? This is a routine wellness examination for Robert Rasmussen. ? ?Hearing/Vision screen ?Hearing Screening - Comments:: No trouble hearing ?Vision Screening - Comments:: [facility]  ?Up to date ? ?Dietary issues and exercise activities discussed: ?Current Exercise Habits: Structured exercise class, Type of exercise: strength training/weights (thi chi), Time (Minutes): 45, Frequency (Times/Week): 3, Weekly Exercise (Minutes/Week): 135, Intensity: Mild ? ? Goals Addressed   ? ?  ?  ?  ?  ? This Visit's Progress  ?  Increase physical activity     ? ?  ? ?Depression Screen ? ?  09/08/2021  ? 10:37 AM 06/25/2021  ? 10:47 AM 08/25/2020   ?  1:46 PM 09/10/2019  ?  2:20 PM 08/20/2019  ?  2:26 PM 08/01/2018  ? 12:59 PM 08/01/2018  ? 12:58 PM  ?PHQ 2/9 Scores  ?PHQ - 2 Score 0 0 0 0 0 0 0  ?PHQ- 9 Score  1   2 0 0  ?  ?Fall Risk ? ?  06/25/2021  ? 10:41 AM 08/25/2020  ?  1:46 PM 09/10/2019  ?  2:20 PM 08/20/2019  ?  2:26 PM 08/01/2018  ? 12:57 PM  ?Fall  Risk   ?Falls in the past year? 0 0 0 0 0  ?Number falls in past yr: 0  0 0 0  ?Injury with Fall? 0  0 0 0  ?Risk for fall due to : No Fall Risks Medication side effect     ?Follow up Falls evaluation completed Falls evaluation completed;Education provided;Falls prevention discussed     ? ? ?FALL RISK PREVENTION PERTAINING TO THE HOME: ? ?Any stairs in or around the home? Yes  ?If so, are there any without handrails? No  ?Home free of loose throw rugs in walkways, pet beds, electrical cords, etc? Yes  ?Adequate lighting in your home to reduce risk of falls? Yes  ? ?ASSISTIVE DEVICES UTILIZED TO PREVENT FALLS: ? ?Life alert? No  ?Use of a cane, walker or w/c? No  ?Grab bars in the bathroom? No  ?Shower chair or bench in shower? Yes  ?Elevated toilet seat or a handicapped toilet? No  ? ?TIMED UP AND GO: ? ?Was the test performed? No .  ? ? ?Cognitive Function: ?  ?  ? ?  09/08/2021  ? 10:32 AM 08/25/2020  ?  1:47 PM  ?6CIT Screen  ?What Year?  0 points  ?What month?  0 points  ?What time? 0 points 0 points  ?Count back from 20 0 points 0 points  ?Months in reverse 0 points 0 points  ?Repeat phrase 0 points 0 points  ?Total Score  0 points  ? ? ?Immunizations ?Immunization History  ?Administered Date(s) Administered  ? Fluad Quad(high Dose 65+) 02/26/2019, 02/28/2020  ? Influenza, High Dose Seasonal PF 03/13/2018  ? Influenza,inj,Quad PF,6+ Mos 03/10/2015, 07/14/2016, 07/19/2017  ? Influenza-Unspecified 05/22/2021  ? PFIZER(Purple Top)SARS-COV-2 Vaccination 07/16/2019, 08/09/2019, 03/25/2020, 11/28/2020, 05/14/2021  ? Pneumococcal Conjugate-13 03/13/2018  ? Pneumococcal Polysaccharide-23 02/26/2019  ? Td 08/23/2007  ?  Tdap 07/14/2016  ? Zoster Recombinat (Shingrix) 02/12/2020, 07/15/2021  ? Zoster, Live 03/23/2013  ? ? ?TDAP status: Up to date ? ?Flu Vaccine status: Up to date ? ?Pneumococcal vaccine status: Up to date ? ?Cov

## 2021-09-08 NOTE — Patient Instructions (Signed)
[name] , ?Thank you for taking time to come for your Medicare Wellness Visit. I appreciate your ongoing commitment to your health goals. Please review the following plan we discussed and let me know if I can assist you in the future.  ? ?Screening recommendations/referrals: ?Colonoscopy: up to date ?Recommended yearly ophthalmology/optometry visit for glaucoma screening and checkup ?Recommended yearly dental visit for hygiene and checkup ? ?Vaccinations: ?Influenza vaccine: up to date ?Pneumococcal vaccine: up to date ?Tdap vaccine: up to date ?Shingles vaccine: up to date   ? ?Advanced directives: copy requested ? ?Conditions/risks identified:  ? ?Next appointment: 12-23-2021 @ 10:00 [place] ? ?Preventive Care 45 Years and Older, Male ?Preventive care refers to lifestyle choices and visits with your health care provider that can promote health and wellness. ?What does preventive care include? ?A yearly physical exam. This is also called an annual well check. ?Dental exams once or twice a year. ?Routine eye exams. Ask your health care provider how often you should have your eyes checked. ?Personal lifestyle choices, including: ?Daily care of your teeth and gums. ?Regular physical activity. ?Eating a healthy diet. ?Avoiding tobacco and drug use. ?Limiting alcohol use. ?Practicing safe sex. ?Taking low doses of aspirin every day. ?Taking vitamin and mineral supplements as recommended by your health care provider. ?What happens during an annual well check? ?The services and screenings done by your health care provider during your annual well check will depend on your age, overall health, lifestyle risk factors, and family history of disease. ?Counseling  ?Your health care provider may ask you questions about your: ?Alcohol use. ?Tobacco use. ?Drug use. ?Emotional well-being. ?Home and relationship well-being. ?Sexual activity. ?Eating habits. ?History of falls. ?Memory and ability to understand (cognition). ?Work and  work Statistician. ?Screening  ?You may have the following tests or measurements: ?Height, weight, and BMI. ?Blood pressure. ?Lipid and cholesterol levels. These may be checked every 5 years, or more frequently if you are over 39 years old. ?Skin check. ?Lung cancer screening. You may have this screening every year starting at age 48 if you have a 30-pack-year history of smoking and currently smoke or have quit within the past 15 years. ?Fecal occult blood test (FOBT) of the stool. You may have this test every year starting at age 5. ?Flexible sigmoidoscopy or colonoscopy. You may have a sigmoidoscopy every 5 years or a colonoscopy every 10 years starting at age 24. ?Prostate cancer screening. Recommendations will vary depending on your family history and other risks. ?Hepatitis C blood test. ?Hepatitis B blood test. ?Sexually transmitted disease (STD) testing. ?Diabetes screening. This is done by checking your blood sugar (glucose) after you have not eaten for a while (fasting). You may have this done every 1-3 years. ?Abdominal aortic aneurysm (AAA) screening. You may need this if you are a current or former smoker. ?Osteoporosis. You may be screened starting at age 74 if you are at high risk. ?Talk with your health care provider about your test results, treatment options, and if necessary, the need for more tests. ?Vaccines  ?Your health care provider may recommend certain vaccines, such as: ?Influenza vaccine. This is recommended every year. ?Tetanus, diphtheria, and acellular pertussis (Tdap, Td) vaccine. You may need a Td booster every 10 years. ?Zoster vaccine. You may need this after age 21. ?Pneumococcal 13-valent conjugate (PCV13) vaccine. One dose is recommended after age 70. ?Pneumococcal polysaccharide (PPSV23) vaccine. One dose is recommended after age 50. ?Talk to your health care provider about which screenings and  vaccines you need and how often you need them. ?This information is not intended to  replace advice given to you by your health care provider. Make sure you discuss any questions you have with your health care provider. ?Document Released: 06/20/2015 Document Revised: 02/11/2016 Document Reviewed: 03/25/2015 ?Elsevier Interactive Patient Education ? 2017 [place]. ? ?Fall Prevention in the Home ?Falls can cause injuries. They can happen to people of all ages. There are many things you can do to make your home safe and to help prevent falls. ?What can I do on the outside of my home? ?Regularly fix the edges of walkways and driveways and fix any cracks. ?Remove anything that might make you trip as you walk through a door, such as a raised step or threshold. ?Trim any bushes or trees on the path to your home. ?Use bright outdoor lighting. ?Clear any walking paths of anything that might make someone trip, such as rocks or tools. ?Regularly check to see if handrails are loose or broken. Make sure that both sides of any steps have handrails. ?Any raised decks and porches should have guardrails on the edges. ?Have any leaves, snow, or ice cleared regularly. ?Use sand or salt on walking paths during winter. ?Clean up any spills in your garage right away. This includes oil or grease spills. ?What can I do in the bathroom? ?Use night lights. ?Install grab bars by the toilet and in the tub and shower. Do not use towel bars as grab bars. ?Use non-skid mats or decals in the tub or shower. ?If you need to sit down in the shower, use a plastic, non-slip stool. ?Keep the floor dry. Clean up any water that spills on the floor as soon as it happens. ?Remove soap buildup in the tub or shower regularly. ?Attach bath mats securely with double-sided non-slip rug tape. ?Do not have throw rugs and other things on the floor that can make you trip. ?What can I do in the bedroom? ?Use night lights. ?Make sure that you have a light by your bed that is easy to reach. ?Do not use any sheets or blankets that are too big for  your bed. They should not hang down onto the floor. ?Have a firm chair that has side arms. You can use this for support while you get dressed. ?Do not have throw rugs and other things on the floor that can make you trip. ?What can I do in the kitchen? ?Clean up any spills right away. ?Avoid walking on wet floors. ?Keep items that you use a lot in easy-to-reach places. ?If you need to reach something above you, use a strong step stool that has a grab bar. ?Keep electrical cords out of the way. ?Do not use floor polish or wax that makes floors slippery. If you must use wax, use non-skid floor wax. ?Do not have throw rugs and other things on the floor that can make you trip. ?What can I do with my stairs? ?Do not leave any items on the stairs. ?Make sure that there are handrails on both sides of the stairs and use them. Fix handrails that are broken or loose. Make sure that handrails are as long as the stairways. ?Check any carpeting to make sure that it is firmly attached to the stairs. Fix any carpet that is loose or worn. ?Avoid having throw rugs at the top or bottom of the stairs. If you do have throw rugs, attach them to the floor with carpet tape. ?Make  sure that you have a light switch at the top of the stairs and the bottom of the stairs. If you do not have them, ask someone to add them for you. ?What else can I do to help prevent falls? ?Wear shoes that: ?Do not have high heels. ?Have rubber bottoms. ?Are comfortable and fit you well. ?Are closed at the toe. Do not wear sandals. ?If you use a stepladder: ?Make sure that it is fully opened. Do not climb a closed stepladder. ?Make sure that both sides of the stepladder are locked into place. ?Ask someone to hold it for you, if possible. ?Clearly mark and make sure that you can see: ?Any grab bars or handrails. ?First and last steps. ?Where the edge of each step is. ?Use tools that help you move around (mobility aids) if they are needed. These  include: ?Canes. ?Walkers. ?Scooters. ?Crutches. ?Turn on the lights when you go into a dark area. Replace any light bulbs as soon as they burn out. ?Set up your furniture so you have a clear path. Avoid moving your furnitu

## 2021-09-17 DIAGNOSIS — Z0100 Encounter for examination of eyes and vision without abnormal findings: Secondary | ICD-10-CM | POA: Diagnosis not present

## 2021-09-17 DIAGNOSIS — H5213 Myopia, bilateral: Secondary | ICD-10-CM | POA: Diagnosis not present

## 2021-10-12 ENCOUNTER — Ambulatory Visit (INDEPENDENT_AMBULATORY_CARE_PROVIDER_SITE_OTHER): Payer: Medicare HMO | Admitting: Internal Medicine

## 2021-10-12 VITALS — BP 136/79 | HR 70 | Resp 18 | Ht 67.0 in | Wt 356.0 lb

## 2021-10-12 DIAGNOSIS — Z9989 Dependence on other enabling machines and devices: Secondary | ICD-10-CM

## 2021-10-12 DIAGNOSIS — Z7189 Other specified counseling: Secondary | ICD-10-CM

## 2021-10-12 DIAGNOSIS — G4733 Obstructive sleep apnea (adult) (pediatric): Secondary | ICD-10-CM | POA: Diagnosis not present

## 2021-10-12 NOTE — Patient Instructions (Signed)

## 2021-10-12 NOTE — Progress Notes (Signed)
[facility] ?[address] ?[place], [place] [id number] ? ?Pulmonary Sleep Medicine  ? ?Office Visit Note ? ?Patient Name: Robert Rasmussen ?DOB: 12/07/1950 ?MRN [id number] ? ? ? ?Chief Complaint: Obstructive Sleep Apnea visit ? ?Brief History: ? ?Robert GoldRussell is seen today for follow up. The patient has a 7 year history of sleep apnea. Patient is using PAP nightly with wide N30i nasal mask.  The patient feels fine after sleeping with PAP.  The patient reports benefiting from PAP use. Epworth Sleepiness Score is 6 out of 24. The patient does not take naps. The patient complains of the following: no complaints.   The compliance download shows  compliance with an average use time of 8: hours@ 100%. The AHI is 0.4  The patient does not complain of limb movements disrupting sleep. ? ?ROS ? ?General: (-) fever, (-) chills, (-) night sweat ?Nose and Sinuses: (-) nasal stuffiness or itchiness, (-) postnasal drip, (-) nosebleeds, (-) sinus trouble. ?Mouth and Throat: (-) sore throat, (-) hoarseness. ?Neck: (-) swollen glands, (-) enlarged thyroid, (-) neck pain. ?Respiratory: - cough, - shortness of breath, - wheezing. ?Neurologic: - numbness, - tingling. ?Psychiatric: - anxiety, - depression ? ? ?Current Medication: ?Outpatient Encounter Medications as of 10/12/2021  ?Medication Sig  ? atorvastatin (LIPITOR) 10 MG tablet Take 1 tablet (10 mg total) by mouth daily.  ? Cholecalciferol (VITAMIN D3) 25 MCG (1000 UT) CAPS Take 1 capsule by mouth daily.  ? Multiple Vitamins-Minerals (ONE-A-DAY MENS 50+ ADVANTAGE) TABS Take 1 tablet by mouth daily.  ? [DISCONTINUED] meloxicam (MOBIC) 15 MG tablet Take 1 tablet (15 mg total) by mouth daily.  ? ?No facility-administered encounter medications on file as of 10/12/2021.  ? ? ?Surgical History: ?Past Surgical History:  ?Procedure Laterality Date  ? TONSILLECTOMY    ? ? ?Medical History: ?Past Medical History:  ?Diagnosis Date  ? Allergy   ? seasonal  ? Amebic dysentery   ? Asthma   ?  Cellulitis   ? GERD (gastroesophageal reflux disease)   ? Hyperlipidemia   ? ? ?Family History: ?Non contributory to the present illness ? ?Social History: ?Social History  ? ?Socioeconomic History  ? Marital status: Divorced  ?  Spouse name: Not on file  ? Number of children: Not on file  ? Years of education: Not on file  ? Highest education level: Not on file  ?Occupational History  ? Occupation: retired  ?Tobacco Use  ? Smoking status: Never  ? Smokeless tobacco: Never  ?Vaping Use  ? Vaping Use: Never used  ?Substance and Sexual Activity  ? Alcohol use: Not Currently  ? Drug use: No  ? Sexual activity: Yes  ?Other Topics Concern  ? Not on file  ?Social History Narrative  ? Not on file  ? ?Social Determinants of Health  ? ?Financial Resource Strain: Low Risk   ? Difficulty of Paying Living Expenses: Not hard at all  ?Food Insecurity: No Food Insecurity  ? Worried About Programme researcher, broadcasting/film/videounning Out of Food in the Last Year: Never true  ? Ran Out of Food in the Last Year: Never true  ?Transportation Needs: No Transportation Needs  ? Lack of Transportation (Medical): No  ? Lack of Transportation (Non-Medical): No  ?Physical Activity: Insufficiently Active  ? Days of Exercise per Week: 3 days  ? Minutes of Exercise per Session: 30 min  ?Stress: No Stress Concern Present  ? Feeling of Stress : Not at all  ?Social Connections: Moderately Isolated  ? Frequency  of Communication with Friends and Family: More than three times a week  ? Frequency of Social Gatherings with Friends and Family: Three times a week  ? Attends Religious Services: Never  ? Active Member of Clubs or Organizations: Yes  ? Attends Banker Meetings: More than 4 times per year  ? Marital Status: Divorced  ?Intimate Partner Violence: Not At Risk  ? Fear of Current or Ex-Partner: No  ? Emotionally Abused: No  ? Physically Abused: No  ? Sexually Abused: No  ? ? ?Vital Signs: ?Blood pressure 136/79, pulse 70, resp. rate 18, height 5\' 7"  (1.702 m), weight (!)  356 lb (161.5 kg), SpO2 96 %. ?Body mass index is 55.76 kg/m?.  ? ? ?Examination: ?General Appearance: The patient is well-developed, well-nourished, and in no distress. ?Neck Circumference: 49 cm ?Skin: Gross inspection of skin unremarkable. ?Head: normocephalic, no gross deformities. ?Eyes: no gross deformities noted. ?ENT: ears appear grossly normal ?Neurologic: Alert and oriented. No involuntary movements. ? ? ? ?EPWORTH SLEEPINESS SCALE: ? ?Scale:  ?(0)= no chance of dozing; (1)= slight chance of dozing; (2)= moderate chance of dozing; (3)= high chance of dozing ? ?Chance  Situtation ?   ?Sitting and reading: 0 ?  ? Watching TV: 1 ?   ?Sitting Inactive in public: 0 ?   ?As a passenger in car: 1   ?   ?Lying down to rest: 2 ?   ?Sitting and talking: 0 ?   ?Sitting quielty after lunch: 2 ?   ?In a car, stopped in traffic: 0 ? ? ?TOTAL SCORE:   6 out of 24 ? ? ? ?SLEEP STUDIES: ? ?03/23/15 -  Split -  AHI 103.9, Low Spo2 85%, recommend APAP @ 9-12cmH2O ? ? ?CPAP COMPLIANCE DATA: ? ?Date Range: 10/07/20 - 10/05/21 ? ?Average Daily Use: 8:21 hours ? ?Median Use: 8:26 hours ? ?Compliance for > 4 Hours: 100% ? ?AHI: 0.4 respiratory events per hour ? ?Days Used: 364/365 ? ?Mask Leak: 6.5 lpm ? ?95th Percentile Pressure: 11 cmH2O ? ? ? ?LABS: ?No results found for this or any previous visit (from the past 2160 hour(s)). ? ?Radiology: ?DG Chest 2 View ? ?Result Date: 06/13/2017 ?CLINICAL DATA:  One month of nonproductive cough worrisome for bronchitis or pneumonia. Mild exertional shortness of breath. History of childhood asthma. Nonsmoker. EXAM: CHEST  2 VIEW COMPARISON:  None in PACs FINDINGS: The lungs are well-expanded. There is no focal infiltrate. The interstitial markings are prominent especially in the lower lobes. There is pleural thickening along the lateral thoracic cage bilaterally. There is no pleural effusion. The heart is top-normal in size. The pulmonary vascularity is normal. The bony thorax exhibits no acute  abnormality. IMPRESSION: There is no pneumonia nor other acute cardiopulmonary abnormality. Mild interstitial prominence likely reflects bronchitic changes either acute or chronic. Electronically Signed   By: David  08/11/2017 M.D.   On: 06/13/2017 09:55  ? ? ?No results found. ? ?No results found. ? ? ? ?Assessment and Plan: ?Patient Active Problem List  ? Diagnosis Date Noted  ? CPAP use counseling 10/12/2021  ? Lower back pain 06/25/2021  ? Sebaceous cyst 10/27/2020  ? OSA on CPAP 06/16/2020  ? Prediabetes 02/29/2020  ? Chronic left shoulder pain 02/28/2020  ? BMI 50.0-59.9, adult (HCC) 10/04/2019  ? Mixed hyperlipidemia 08/01/2018  ? Vitamin D deficiency 07/30/2018  ? Benign hypertension 06/08/2017  ? Morbid obesity (HCC) 03/10/2015  ? ? ?1. OSA on CPAP ?The patient does tolerate PAP  and reports definite benefit from PAP use. The patient was reminded how to clean equipment and advised to replace supplies routinely. The patient was also counselled on weight loss. The compliance is excellent. The AHI is 0.4. ? ? ?OSA- continue with excellent compliance with pap. F/u one year.  ? ?2. CPAP use counseling ?CPAP Counseling: had a lengthy discussion with the patient regarding the importance of PAP therapy in management of the sleep apnea. Patient appears to understand the risk factor reduction and also understands the risks associated with untreated sleep apnea. Patient will try to make a good faith effort to remain compliant with therapy. Also instructed the patient on proper cleaning of the device including the water must be changed daily if possible and use of distilled water is preferred. Patient understands that the machine should be regularly cleaned with appropriate recommended cleaning solutions that do not damage the PAP machine for example given white vinegar and water rinses. Other methods such as ozone treatment may not be as good as these simple methods to achieve cleaning.  ? ?3. Morbid obesity (HCC) ?Obesity  Counseling: Had a lengthy discussion regarding patients BMI and weight issues. Patient was instructed on portion control as well as increased activity. Also discussed caloric restrictions with trying to

## 2021-12-03 ENCOUNTER — Other Ambulatory Visit: Payer: Self-pay | Admitting: Nurse Practitioner

## 2021-12-03 NOTE — Telephone Encounter (Signed)
Requested Prescriptions  Pending Prescriptions Disp Refills  . atorvastatin (LIPITOR) 10 MG tablet [Pharmacy Med Name: ATORVASTATIN 10 MG TABLET] 90 tablet 1    Sig: TAKE ONE TABLET BY MOUTH DAILY     Cardiovascular:  Antilipid - Statins Failed - 12/03/2021  6:21 AM      Failed - Lipid Panel in normal range within the last 12 months    Cholesterol, Total  Date Value Ref Range Status  06/25/2021 145 100 - 199 mg/dL Final   LDL Chol Calc (NIH)  Date Value Ref Range Status  06/25/2021 80 0 - 99 mg/dL Final   HDL  Date Value Ref Range Status  06/25/2021 55 >39 mg/dL Final   Triglycerides  Date Value Ref Range Status  06/25/2021 47 0 - 149 mg/dL Final         Passed - Patient is not pregnant      Passed - Valid encounter within last 12 months    Recent Outpatient Visits          5 months ago Morbid obesity (HCC)   [facility] [place], Isola T, NP   1 year ago Morbid obesity (HCC)   [facility] [place], Buell T, NP   1 year ago BMI 50.0-59.9, adult (HCC)   [facility] Valentino Nose, NP   2 years ago Morbid obesity (HCC)   [facility] [place], Pateros T, NP   2 years ago Mixed hyperlipidemia   [facility] [place], Dorie Rank, NP      Future Appointments            In 2 weeks [place], Dorie Rank, NP [employer], [facility]

## 2021-12-07 ENCOUNTER — Other Ambulatory Visit: Payer: Self-pay | Admitting: Nurse Practitioner

## 2021-12-07 MED ORDER — ATORVASTATIN CALCIUM 10 MG PO TABS
10.0000 mg | ORAL_TABLET | Freq: Every day | ORAL | 4 refills | Status: DC
Start: 2021-12-07 — End: 2021-12-09

## 2021-12-09 ENCOUNTER — Other Ambulatory Visit: Payer: Self-pay | Admitting: Nurse Practitioner

## 2021-12-09 MED ORDER — ATORVASTATIN CALCIUM 10 MG PO TABS
10.0000 mg | ORAL_TABLET | Freq: Every day | ORAL | 4 refills | Status: DC
Start: 2021-12-09 — End: 2022-01-12

## 2021-12-23 ENCOUNTER — Ambulatory Visit: Payer: Medicare HMO | Admitting: Nurse Practitioner

## 2022-01-04 DIAGNOSIS — G4733 Obstructive sleep apnea (adult) (pediatric): Secondary | ICD-10-CM | POA: Diagnosis not present

## 2022-01-10 NOTE — Patient Instructions (Addendum)
Look into Ozempic and [id number].    Prediabetes Eating Plan Prediabetes is a condition that causes blood sugar (glucose) levels to be higher than normal. This increases the risk for developing type 2 diabetes (type 2 diabetes mellitus). Working with a health care provider or nutrition specialist (dietitian) to make diet and lifestyle changes can help prevent the onset of diabetes. These changes may help you: Control your blood glucose levels. Improve your cholesterol levels. Manage your blood pressure. What are tips for following this plan? Reading food labels Read food labels to check the amount of fat, salt (sodium), and sugar in prepackaged foods. Avoid foods that have: Saturated fats. Trans fats. Added sugars. Avoid foods that have more than 300 milligrams (mg) of sodium per serving. Limit your sodium intake to less than 2,300 mg each day. Shopping Avoid buying pre-made and processed foods. Avoid buying drinks with added sugar. Cooking Cook with olive oil. Do not use butter, lard, or ghee. Bake, broil, grill, steam, or boil foods. Avoid frying. Meal planning  Work with your dietitian to create an eating plan that is right for you. This may include tracking how many calories you take in each day. Use a food diary, notebook, or mobile application to track what you eat at each meal. Consider following a Mediterranean diet. This includes: Eating several servings of fresh fruits and vegetables each day. Eating fish at least twice a week. Eating one serving each day of whole grains, beans, nuts, and seeds. Using olive oil instead of other fats. Limiting alcohol. Limiting red meat. Using nonfat or low-fat dairy products. Consider following a plant-based diet. This includes dietary choices that focus on eating mostly vegetables and fruit, grains, beans, nuts, and seeds. If you have high blood pressure, you may need to limit your sodium intake or follow a diet such as the DASH (Dietary  Approaches to Stop Hypertension) eating plan. The DASH diet aims to lower high blood pressure. Lifestyle Set weight loss goals with help from your health care team. It is recommended that most people with prediabetes lose 7% of their body weight. Exercise for at least 30 minutes 5 or more days a week. Attend a support group or seek support from a mental health counselor. Take over-the-counter and prescription medicines only as told by your health care provider. What foods are recommended? Fruits Berries. Bananas. Apples. Oranges. Grapes. Papaya. Mango. Pomegranate. Kiwi. Grapefruit. Cherries. Vegetables Lettuce. Spinach. Peas. Beets. Cauliflower. Cabbage. Broccoli. Carrots. Tomatoes. Squash. Eggplant. Herbs. Peppers. Onions. Cucumbers. Brussels sprouts. Grains Whole grains, such as whole-wheat or whole-grain breads, crackers, cereals, and pasta. Unsweetened oatmeal. Bulgur. Barley. Quinoa. Brown rice. Corn or whole-wheat flour tortillas or taco shells. Meats and other proteins Seafood. Poultry without skin. Lean cuts of pork and beef. Tofu. Eggs. Nuts. Beans. Dairy Low-fat or fat-free dairy products, such as yogurt, cottage cheese, and cheese. Beverages Water. Tea. Coffee. Sugar-free or diet soda. Seltzer water. Low-fat or nonfat milk. Milk alternatives, such as soy or almond milk. Fats and oils Olive oil. Canola oil. Sunflower oil. Grapeseed oil. Avocado. Walnuts. Sweets and desserts Sugar-free or low-fat pudding. Sugar-free or low-fat ice cream and other frozen treats. Seasonings and condiments Herbs. Sodium-free spices. Mustard. Relish. Low-salt, low-sugar ketchup. Low-salt, low-sugar barbecue sauce. Low-fat or fat-free mayonnaise. The items listed above may not be a complete list of recommended foods and beverages. Contact a dietitian for more information. What foods are not recommended? Fruits Fruits canned with syrup. Vegetables Canned vegetables. Frozen vegetables with butter or  cream sauce. Grains Refined white flour and flour products, such as bread, pasta, snack foods, and cereals. Meats and other proteins Fatty cuts of meat. Poultry with skin. Breaded or fried meat. Processed meats. Dairy Full-fat yogurt, cheese, or milk. Beverages Sweetened drinks, such as iced tea and soda. Fats and oils Butter. Lard. Ghee. Sweets and desserts Baked goods, such as cake, cupcakes, pastries, cookies, and cheesecake. Seasonings and condiments Spice mixes with added salt. Ketchup. Barbecue sauce. Mayonnaise. The items listed above may not be a complete list of foods and beverages that are not recommended. Contact a dietitian for more information. Where to find more information American Diabetes Association: www.diabetes.org Summary You may need to make diet and lifestyle changes to help prevent the onset of diabetes. These changes can help you control blood sugar, improve cholesterol levels, and manage blood pressure. Set weight loss goals with help from your health care team. It is recommended that most people with prediabetes lose 7% of their body weight. Consider following a Mediterranean diet. This includes eating plenty of fresh fruits and vegetables, whole grains, beans, nuts, seeds, fish, and low-fat dairy, and using olive oil instead of other fats. This information is not intended to replace advice given to you by your health care provider. Make sure you discuss any questions you have with your health care provider. Document Revised: 08/23/2019 Document Reviewed: 08/23/2019 Elsevier Patient Education  2023 [employer].

## 2022-01-12 ENCOUNTER — Ambulatory Visit (INDEPENDENT_AMBULATORY_CARE_PROVIDER_SITE_OTHER): Payer: Medicare HMO | Admitting: Nurse Practitioner

## 2022-01-12 ENCOUNTER — Encounter: Payer: Self-pay | Admitting: Nurse Practitioner

## 2022-01-12 DIAGNOSIS — Z6841 Body Mass Index (BMI) 40.0 and over, adult: Secondary | ICD-10-CM

## 2022-01-12 DIAGNOSIS — E782 Mixed hyperlipidemia: Secondary | ICD-10-CM

## 2022-01-12 DIAGNOSIS — I10 Essential (primary) hypertension: Secondary | ICD-10-CM

## 2022-01-12 DIAGNOSIS — R7303 Prediabetes: Secondary | ICD-10-CM | POA: Diagnosis not present

## 2022-01-12 DIAGNOSIS — G4733 Obstructive sleep apnea (adult) (pediatric): Secondary | ICD-10-CM

## 2022-01-12 LAB — BAYER DCA HB A1C WAIVED: HB A1C (BAYER DCA - WAIVED): 6.2 % — ABNORMAL HIGH (ref 4.8–5.6)

## 2022-01-12 MED ORDER — ATORVASTATIN CALCIUM 10 MG PO TABS: 10.0000 mg | ORAL_TABLET | Freq: Every day | ORAL | 4 refills | Status: DC

## 2022-01-12 NOTE — Assessment & Plan Note (Addendum)
BMI 55.79.  Would benefit from modest weight loss, we discussed adding on Ozempic or Wegovy as is in prediabetic range -- he will review and let provider know if he would like to initiate one and coverage details. Educated at length on these medications.  Recommended eating smaller high protein, low fat meals more frequently and exercising 30 mins a day 5 times a week with a goal of 10-15lb weight loss in the next 3 months. Patient voiced their understanding and motivation to adhere to these recommendations.

## 2022-01-12 NOTE — Progress Notes (Signed)
BP 126/72   Pulse (!) 57   Temp 98.4 F (36.9 C) (Oral)   Ht 5\' 7"  (1.702 m)   Wt (!) 356 lb 3.2 oz (161.6 kg)   SpO2 96%   BMI 55.79 kg/m    Subjective:    Patient ID: Robert Rasmussen, male    DOB: [birth year], 71 y.o.   MRN: [id number]  HPI: Robert Rasmussen is a 71 y.o. male  Chief Complaint  Patient presents with   Hyperlipidemia   Prediabetes   Medication Refill    Patient is requesting a refill on Cholesterol medication.    HYPERLIPIDEMIA Continues on Lipitor 10 MG daily, has missed 2 weeks as prescription expired. Hyperlipidemia status: good compliance Satisfied with current treatment?  yes Side effects:  no Medication compliance: good compliance Supplements: none Aspirin:  no The 10-year ASCVD risk score (Arnett DK, et al., 2019) is: 13.3%   Values used to calculate the score:     Age: 63 years     Sex: Male     Is Non-Hispanic African American: No     Diabetic: No     Tobacco smoker: No     Systolic Blood Pressure: 126 mmHg     Is BP treated: No     HDL Cholesterol: 55 mg/dL     Total Cholesterol: 145 mg/dL Chest pain:  no Coronary artery disease:  no Family history CAD:  yes -- grandmother at 53 MI Family history early CAD:  no   PREDIABETES Last A1c 5.8% in January.  Does enjoy sugar per his report.  We discussed GLP1 medications: Ozempic or Wegovy, he will research more.  No family history of thyroid cancer (MTC, MEN 2, thyroid cell tumors) or pancreatitis.    Polydipsia/polyuria: no Visual disturbance: no Chest pain: no Paresthesias: no  Relevant past medical, surgical, family and social history reviewed and updated as indicated. Interim medical history since our last visit reviewed. Allergies and medications reviewed and updated.  Review of Systems  Constitutional:  Negative for activity change, diaphoresis, fatigue and fever.  Respiratory:  Negative for cough, chest tightness, shortness of breath and wheezing.   Cardiovascular:  Negative for  chest pain, palpitations and leg swelling.  Gastrointestinal: Negative.   Neurological: Negative.   Psychiatric/Behavioral: Negative.      Per HPI unless specifically indicated above     Objective:    BP 126/72   Pulse (!) 57   Temp 98.4 F (36.9 C) (Oral)   Ht 5\' 7"  (1.702 m)   Wt (!) 356 lb 3.2 oz (161.6 kg)   SpO2 96%   BMI 55.79 kg/m   Wt Readings from Last 3 Encounters:  01/12/22 (!) 356 lb 3.2 oz (161.6 kg)  10/12/21 (!) 356 lb (161.5 kg)  06/25/21 (!) 357 lb 3.2 oz (162 kg)    Physical Exam Vitals and nursing note reviewed.  Constitutional:      General: He is awake. He is not in acute distress.    Appearance: He is well-developed and well-groomed. He is obese. He is not ill-appearing or toxic-appearing.  HENT:     Head: Normocephalic and atraumatic.     Right Ear: Hearing normal. No drainage.     Left Ear: Hearing normal. No drainage.  Eyes:     General: Lids are normal.        Right eye: No discharge.        Left eye: No discharge.     Conjunctiva/sclera: Conjunctivae normal.  Pupils: Pupils are equal, round, and reactive to light.  Neck:     Thyroid: No thyromegaly.     Vascular: No carotid bruit.  Cardiovascular:     Rate and Rhythm: Normal rate and regular rhythm.     Heart sounds: Normal heart sounds, S1 normal and S2 normal. No murmur heard.    No gallop.  Pulmonary:     Effort: Pulmonary effort is normal. No accessory muscle usage or respiratory distress.     Breath sounds: Normal breath sounds.  Abdominal:     General: Bowel sounds are normal.     Palpations: Abdomen is soft.  Musculoskeletal:        General: Normal range of motion.     Cervical back: Normal range of motion and neck supple.     Right lower leg: No edema.     Left lower leg: No edema.  Skin:    General: Skin is warm and dry.     Capillary Refill: Capillary refill takes less than 2 seconds.  Neurological:     Mental Status: He is alert and oriented to person, place, and  time.     Deep Tendon Reflexes: Reflexes are normal and symmetric.  Psychiatric:        Attention and Perception: Attention normal.        Mood and Affect: Mood normal.        Speech: Speech normal.        Behavior: Behavior normal. Behavior is cooperative.        Thought Content: Thought content normal.    Results for orders placed or performed in visit on 01/12/22  Bayer DCA Hb A1c Waived  Result Value Ref Range   HB A1C (BAYER DCA - WAIVED) 6.2 (H) 4.8 - 5.6 %      Assessment & Plan:   Problem List Items Addressed This Visit       Cardiovascular and Mediastinum   Benign hypertension    Chronic, ongoing with BP at goal today without medication.  Recommend he monitor BP at least a few mornings a week at home and document.  DASH diet at home. Labs today: BMP.  Return in 6 months.       Relevant Medications   atorvastatin (LIPITOR) 10 MG tablet   Other Relevant Orders   Basic metabolic panel     Other   BMI 50.0-59.9, adult (HCC)    Refer to morbid obesity plan of care.       Mixed hyperlipidemia    Chronic, ongoing.  Continue focus on diet changes and continue Atorvastatin 10 MG daily, recheck lipid panel today and adjust medication as needed.  Refills sent in.      Relevant Medications   atorvastatin (LIPITOR) 10 MG tablet   Other Relevant Orders   Lipid Panel w/o Chol/HDL Ratio   Morbid obesity (HCC) - Primary    BMI 55.79.  Would benefit from modest weight loss, we discussed adding on Ozempic or Wegovy as is in prediabetic range -- he will review and let provider know if he would like to initiate one and coverage details. Educated at length on these medications.  Recommended eating smaller high protein, low fat meals more frequently and exercising 30 mins a day 5 times a week with a goal of 10-15lb weight loss in the next 3 months. Patient voiced their understanding and motivation to adhere to these recommendations.       Prediabetes    Ongoing.  Last check  5.8%  = today is 6.2%.  Continue diet focus at home and we discussed initiation of Wegovy or Ozempic, he will look into coverage and alert provider.  Educated at length on these medications.      Relevant Orders   Bayer DCA Hb A1c Waived (Completed)   Basic metabolic panel     Follow up plan: Return in about 6 months (around 07/15/2022) for Annual physical after 06/25/22.

## 2022-01-12 NOTE — Assessment & Plan Note (Signed)
Ongoing.  Last check 5.8% = today is 6.2%.  Continue diet focus at home and we discussed initiation of Wegovy or Ozempic, he will look into coverage and alert provider.  Educated at length on these medications.

## 2022-01-12 NOTE — Assessment & Plan Note (Addendum)
Chronic, ongoing.  Continue focus on diet changes and continue Atorvastatin 10 MG daily, recheck lipid panel today and adjust medication as needed.  Refills sent in.

## 2022-01-12 NOTE — Assessment & Plan Note (Signed)
Refer to morbid obesity plan of care. 

## 2022-01-12 NOTE — Assessment & Plan Note (Signed)
Chronic, ongoing with BP at goal today without medication.  Recommend he monitor BP at least a few mornings a week at home and document.  DASH diet at home. Labs today: BMP.  Return in 6 months.

## 2022-01-13 LAB — BASIC METABOLIC PANEL
BUN/Creatinine Ratio: 17 (ref 10–24)
BUN: 17 mg/dL (ref 8–27)
CO2: 20 mmol/L (ref 20–29)
Calcium: 9.1 mg/dL (ref 8.6–10.2)
Chloride: 104 mmol/L (ref 96–106)
Creatinine, Ser: 1.02 mg/dL (ref 0.76–1.27)
Glucose: 109 mg/dL — ABNORMAL HIGH (ref 70–99)
Potassium: 4.4 mmol/L (ref 3.5–5.2)
Sodium: 142 mmol/L (ref 134–144)
eGFR: 80 mL/min/{1.73_m2} (ref >59)

## 2022-01-13 LAB — LIPID PANEL W/O CHOL/HDL RATIO
Cholesterol, Total: 126 mg/dL (ref 100–199)
HDL: 42 mg/dL (ref >39)
LDL Chol Calc (NIH): 70 mg/dL (ref 0–99)
Triglycerides: 64 mg/dL (ref 0–149)
VLDL Cholesterol Cal: 14 mg/dL (ref 5–40)

## 2022-01-13 NOTE — Progress Notes (Signed)
Contacted via MyChart   Good afternoon Robert Rasmussen, your labs have returned and overall remain stable.  No changes needed.  You are doing fantastic!!  Any questions? Keep being amazing!!  Thank you for allowing me to participate in your care.  I appreciate you. Kindest regards, Ger Nicks

## 2022-06-09 DIAGNOSIS — G4733 Obstructive sleep apnea (adult) (pediatric): Secondary | ICD-10-CM | POA: Diagnosis not present

## 2022-06-28 ENCOUNTER — Encounter: Payer: Medicare HMO | Admitting: Nurse Practitioner

## 2022-08-07 NOTE — Patient Instructions (Signed)

## 2022-08-09 ENCOUNTER — Encounter: Payer: Self-pay | Admitting: Nurse Practitioner

## 2022-08-09 ENCOUNTER — Ambulatory Visit (INDEPENDENT_AMBULATORY_CARE_PROVIDER_SITE_OTHER): Payer: Medicare HMO | Admitting: Nurse Practitioner

## 2022-08-09 DIAGNOSIS — E559 Vitamin D deficiency, unspecified: Secondary | ICD-10-CM

## 2022-08-09 DIAGNOSIS — G4733 Obstructive sleep apnea (adult) (pediatric): Secondary | ICD-10-CM | POA: Diagnosis not present

## 2022-08-09 DIAGNOSIS — R7303 Prediabetes: Secondary | ICD-10-CM

## 2022-08-09 DIAGNOSIS — Z0000 Encounter for general adult medical examination without abnormal findings: Secondary | ICD-10-CM

## 2022-08-09 DIAGNOSIS — I10 Essential (primary) hypertension: Secondary | ICD-10-CM | POA: Diagnosis not present

## 2022-08-09 DIAGNOSIS — N400 Enlarged prostate without lower urinary tract symptoms: Secondary | ICD-10-CM | POA: Diagnosis not present

## 2022-08-09 DIAGNOSIS — E782 Mixed hyperlipidemia: Secondary | ICD-10-CM

## 2022-08-09 DIAGNOSIS — Z6841 Body Mass Index (BMI) 40.0 and over, adult: Secondary | ICD-10-CM

## 2022-08-09 LAB — MICROALBUMIN, URINE WAIVED
Creatinine, Urine Waived: 50 mg/dL (ref 10–300)
Microalb, Ur Waived: 10 mg/L (ref 0–19)

## 2022-08-09 NOTE — Progress Notes (Signed)
BP 122/65   Pulse (!) 56   Temp 98.7 F (37.1 C) (Oral)   Ht 5' 7.01" (1.702 m)   Wt (!) 354 lb 6.4 oz (160.8 kg)   SpO2 98%   BMI 55.49 kg/m    Subjective:    Patient ID: Robert Rasmussen, male    DOB: 17-Jan-1951, 72 y.o.   MRN: [id number]  HPI: BRONSYN HARROWER is a 72 y.o. male presenting on 08/09/2022 for comprehensive medical examination. Current medical complaints include:none  He currently lives with: self Interim Problems from his last visit: no  HYPERLIPIDEMIA Currently taking Atorvastatin daily.  Uses CPAP at home 100% of the time.  BP checks at home once a month showing 130/80 range at baseline. Hyperlipidemia status: good compliance Satisfied with current treatment?  yes Side effects:  no Medication compliance: good compliance Past cholesterol meds: no Supplements: none Aspirin:  no The ASCVD Risk score (Arnett DK, et al., 2019) failed to calculate for the following reasons:   The valid total cholesterol range is 130 to 320 mg/dL Chest pain:  no Coronary artery disease:  no Family history CAD:  yes -- paternal grandmother Family history early CAD:  no   PREDIABETES 6.2% August 2023. Polydipsia/polyuria: no Visual disturbance: no Chest pain: no Paresthesias: no  Functional Status Survey: Is the patient deaf or have difficulty hearing?: No Does the patient have difficulty seeing, even when wearing glasses/contacts?: No Does the patient have difficulty concentrating, remembering, or making decisions?: No Does the patient have difficulty walking or climbing stairs?: No Does the patient have difficulty dressing or bathing?: No Does the patient have difficulty doing errands alone such as visiting a doctor's office or shopping?: No     08/20/2019    2:26 PM 09/10/2019    2:20 PM 08/25/2020    1:46 PM 06/25/2021   10:41 AM 08/09/2022    1:38 PM  [place] in the past year? 0 0 0 0 0  Was there an injury with Fall? 0 0  0 0  Fall Risk Category Calculator 0 0   0 0  Fall Risk Category (Retired) Low Low  Low   (RETIRED) Patient Fall Risk Level Low fall risk Low fall risk Low fall risk Low fall risk   Patient at Risk for Falls Due to   Medication side effect No Fall Risks No Fall Risks  Fall risk Follow up   Falls evaluation completed;Education provided;Falls prevention discussed Falls evaluation completed Falls evaluation completed        08/09/2022    1:38 PM 01/12/2022   10:28 AM 09/08/2021   10:37 AM 06/25/2021   10:47 AM 08/25/2020    1:46 PM  Depression screen PHQ 2/9  Decreased Interest 0 0 0 0 0  Down, Depressed, Hopeless 0 0 0 0 0  PHQ - 2 Score 0 0 0 0 0  Altered sleeping 0 0  0   Tired, decreased energy 1 1  0   Change in appetite 0 1  1   Feeling bad or failure about yourself  0 0  0   Trouble concentrating 0 0  0   Moving slowly or fidgety/restless 0 0  0   Suicidal thoughts 0 0     PHQ-9 Score '1 2  1   '$ Difficult doing work/chores Not difficult at all Not difficult at all  Not difficult at all     Advanced Directives <no information>  Past Medical History:  Past  Medical History:  Diagnosis Date   Allergy    seasonal   Amebic dysentery    Asthma    Cellulitis    GERD (gastroesophageal reflux disease)    Hyperlipidemia     Surgical History:  Past Surgical History:  Procedure Laterality Date   TONSILLECTOMY      Medications:  Current Outpatient Medications on File Prior to Visit  Medication Sig   atorvastatin (LIPITOR) 10 MG tablet Take 1 tablet (10 mg total) by mouth daily.   Cholecalciferol (VITAMIN D3) 25 MCG (1000 UT) CAPS Take 1 capsule by mouth daily.   Multiple Vitamins-Minerals (ONE-A-DAY MENS 50+ ADVANTAGE) TABS Take 1 tablet by mouth daily.   No current facility-administered medications on file prior to visit.    Allergies:  No Known Allergies  Social History:  Social History   Socioeconomic History   Marital status: Divorced    Spouse name: Not on file   Number of children: Not on file   Years  of education: Not on file   Highest education level: Not on file  Occupational History   Occupation: retired  Tobacco Use   Smoking status: Never   Smokeless tobacco: Never  Vaping Use   Vaping Use: Never used  Substance and Sexual Activity   Alcohol use: Not Currently   Drug use: No   Sexual activity: Yes  Other Topics Concern   Not on file  Social History Narrative   Not on file   Social Determinants of Health   Financial Resource Strain: Low Risk  (09/08/2021)   Overall Financial Resource Strain (CARDIA)    Difficulty of Paying Living Expenses: Not hard at all  Food Insecurity: No Food Insecurity (09/08/2021)   Hunger Vital Sign    Worried About Running Out of Food in the Last Year: Never true    [place] in the Last Year: Never true  Transportation Needs: No Transportation Needs (09/08/2021)   PRAPARE - Hydrologist (Medical): No    Lack of Transportation (Non-Medical): No  Physical Activity: Insufficiently Active (09/08/2021)   Exercise Vital Sign    Days of Exercise per Week: 3 days    Minutes of Exercise per Session: 30 min  Stress: No Stress Concern Present (09/08/2021)   [place]    Feeling of Stress : Not at all  Social Connections: Moderately Isolated (09/08/2021)   Social Connection and Isolation Panel [NHANES]    Frequency of Communication with Friends and Family: More than three times a week    Frequency of Social Gatherings with Friends and Family: Three times a week    Attends Religious Services: Never    Active Member of Clubs or Organizations: Yes    Attends Archivist Meetings: More than 4 times per year    Marital Status: Divorced  Intimate Partner Violence: Not At Risk (09/08/2021)   Humiliation, Afraid, Rape, and Kick questionnaire    Fear of Current or Ex-Partner: No    Emotionally Abused: No    Physically Abused: No    Sexually Abused: No    Social History   Tobacco Use  Smoking Status Never  Smokeless Tobacco Never   Social History   Substance and Sexual Activity  Alcohol Use Not Currently    Family History:  Family History  Problem Relation Age of Onset   Cancer Mother        breast   Breast cancer  Mother    Cancer Father        skin   Skin cancer Father    Cancer Sister        breast   Breast cancer Sister    Heart disease Paternal Grandmother        heart attack   Prostate cancer Neg Hx    Colon cancer Neg Hx    Past medical history, surgical history, medications, allergies, family history and social history reviewed with patient today and changes made to appropriate areas of the chart.   ROS All other ROS negative except what is listed above and in the HPI.      Objective:    BP 122/65   Pulse (!) 56   Temp 98.7 F (37.1 C) (Oral)   Ht 5' 7.01" (1.702 m)   Wt (!) 354 lb 6.4 oz (160.8 kg)   SpO2 98%   BMI 55.49 kg/m   Wt Readings from Last 3 Encounters:  08/09/22 (!) 354 lb 6.4 oz (160.8 kg)  01/12/22 (!) 356 lb 3.2 oz (161.6 kg)  10/12/21 (!) 356 lb (161.5 kg)    Physical Exam Vitals and nursing note reviewed.  Constitutional:      General: He is awake. He is not in acute distress.    Appearance: He is well-developed and well-groomed. He is morbidly obese. He is not ill-appearing or toxic-appearing.  HENT:     Head: Normocephalic and atraumatic.     Right Ear: Hearing, tympanic membrane, ear canal and external ear normal. No drainage.     Left Ear: Hearing, tympanic membrane, ear canal and external ear normal. No drainage.     Nose: Nose normal.     Mouth/Throat:     Pharynx: Uvula midline.  Eyes:     General: Lids are normal.        Right eye: No discharge.        Left eye: No discharge.     Extraocular Movements: Extraocular movements intact.     Conjunctiva/sclera: Conjunctivae normal.     Pupils: Pupils are equal, round, and reactive to light.     Visual Fields: Right eye  visual fields normal and left eye visual fields normal.  Neck:     Thyroid: No thyromegaly.     Vascular: No carotid bruit or JVD.     Trachea: Trachea normal.  Cardiovascular:     Rate and Rhythm: Regular rhythm. Bradycardia present.     Heart sounds: Normal heart sounds, S1 normal and S2 normal. No murmur heard.    No gallop.  Pulmonary:     Effort: Pulmonary effort is normal. No accessory muscle usage or respiratory distress.     Breath sounds: Normal breath sounds.  Abdominal:     General: Bowel sounds are normal.     Palpations: Abdomen is soft. There is no hepatomegaly or splenomegaly.     Tenderness: There is no abdominal tenderness.  Musculoskeletal:        General: Normal range of motion.     Cervical back: Normal range of motion and neck supple.     Right lower leg: No edema.     Left lower leg: No edema.  Lymphadenopathy:     Head:     Right side of head: No submental, submandibular, tonsillar, preauricular or posterior auricular adenopathy.     Left side of head: No submental, submandibular, tonsillar, preauricular or posterior auricular adenopathy.     Cervical: No cervical adenopathy.  Skin:  General: Skin is warm and dry.     Capillary Refill: Capillary refill takes less than 2 seconds.     Findings: No rash.  Neurological:     Mental Status: He is alert and oriented to person, place, and time.     Gait: Gait is intact.     Deep Tendon Reflexes: Reflexes are normal and symmetric.     Reflex Scores:      Brachioradialis reflexes are 2+ on the right side and 2+ on the left side.      Patellar reflexes are 2+ on the right side and 2+ on the left side. Psychiatric:        Attention and Perception: Attention normal.        Mood and Affect: Mood normal.        Speech: Speech normal.        Behavior: Behavior normal. Behavior is cooperative.        Thought Content: Thought content normal.        Cognition and Memory: Cognition normal.       09/08/2021   10:32  AM 08/25/2020    1:47 PM  6CIT Screen  What Year?  0 points  What month?  0 points  What time? 0 points 0 points  Count back from 20 0 points 0 points  Months in reverse 0 points 0 points  Repeat phrase 0 points 0 points  Total Score  0 points   Results for orders placed or performed in visit on 01/12/22  Bayer DCA Hb A1c Waived  Result Value Ref Range   HB A1C (BAYER DCA - WAIVED) 6.2 (H) 4.8 - 5.6 %  Basic metabolic panel  Result Value Ref Range   Glucose 109 (H) 70 - 99 mg/dL   BUN 17 8 - 27 mg/dL   Creatinine, Ser 1.02 0.76 - 1.27 mg/dL   eGFR 80 >59 mL/min/1.73   BUN/Creatinine Ratio 17 10 - 24   Sodium 142 134 - 144 mmol/L   Potassium 4.4 3.5 - 5.2 mmol/L   Chloride 104 96 - 106 mmol/L   CO2 20 20 - 29 mmol/L   Calcium 9.1 8.6 - 10.2 mg/dL  Lipid Panel w/o Chol/HDL Ratio  Result Value Ref Range   Cholesterol, Total 126 100 - 199 mg/dL   Triglycerides 64 0 - 149 mg/dL   HDL 42 >39 mg/dL   VLDL Cholesterol Cal 14 5 - 40 mg/dL   LDL Chol Calc (NIH) 70 0 - 99 mg/dL      Assessment & Plan:   Problem List Items Addressed This Visit       Cardiovascular and Mediastinum   Benign hypertension    Chronic, stable.  BP at goal in office today without medication, he is active.  Hiking often.  Recommend he monitor BP at least a few mornings a week at home and document.  DASH diet at home. Labs today: CBC, CMP, TSH, urine ALB.  Return in 6 months.       Relevant Orders   CBC with Differential/Platelet   Comprehensive metabolic panel   TSH   Microalbumin, Urine Waived     Respiratory   OSA on CPAP    Chronic, ongoing.  Continue 100% use of CPAP.        Other   BMI 50.0-59.9, adult [facility])    Refer to morbid obesity plan of care.       Mixed hyperlipidemia    Chronic, ongoing.  Continue focus  on diet changes and continue Atorvastatin 10 MG daily, recheck lipid panel today and adjust medication as needed.  Refills up to date.      Relevant Orders   Comprehensive  metabolic panel   Lipid Panel w/o Chol/HDL Ratio   Morbid obesity ([place]) - Primary    BMI 55.49.  Educated at length on these medications.  Recommended eating smaller high protein, low fat meals more frequently and exercising 30 mins a day 5 times a week with a goal of 10-15lb weight loss in the next 3 months. Patient voiced their understanding and motivation to adhere to these recommendations.       Prediabetes    Ongoing.  Last check 6.2%.  Continue diet focus at home and we discussed initiation of Ozempic in future if needed, he will look into coverage and alert provider.  Educated at length on these medications.      Relevant Orders   HgB A1c   Microalbumin, Urine Waived   Vitamin D deficiency    Chronic, ongoing, continue supplement daily and recheck level today.      Relevant Orders   VITAMIN D 25 Hydroxy (Vit-D Deficiency, Fractures)   Other Visit Diagnoses     Benign prostatic hyperplasia without lower urinary tract symptoms       Relevant Orders   PSA   Encounter for annual physical exam       Annual physical today with labs and health maintenance reviewed, discussed with patient.       Discussed aspirin prophylaxis for myocardial infarction prevention and decision was it was not indicated  LABORATORY TESTING:  Health maintenance labs ordered today as discussed above.   The natural history of prostate cancer and ongoing controversy regarding screening and potential treatment outcomes of prostate cancer has been discussed with the patient. The meaning of a false positive PSA and a false negative PSA has been discussed. He indicates understanding of the limitations of this screening test and wishes to proceed with screening PSA testing.   IMMUNIZATIONS:   - Tdap: Tetanus vaccination status reviewed: last tetanus booster within 10 years. - Influenza:Up To Date - Pneumovax: Up to date - Prevnar: Up to date - Zostavax vaccine: Up To Date  SCREENING: - Colonoscopy: Up  to date -- Cologuard Discussed with patient purpose of the colonoscopy is to detect colon cancer at curable precancerous or early stages   - AAA Screening: Not applicable  -Hearing Test: Not applicable  -Spirometry: Not applicable   PATIENT COUNSELING:    Sexuality: Discussed sexually transmitted diseases, partner selection, use of condoms, avoidance of unintended pregnancy  and contraceptive alternatives.   Advised to avoid cigarette smoking.  I discussed with the patient that most people either abstain from alcohol or drink within safe limits (<=14/week and <=4 drinks/occasion for males, <=7/weeks and <= 3 drinks/occasion for females) and that the risk for alcohol disorders and other health effects rises proportionally with the number of drinks per week and how often a drinker exceeds daily limits.  Discussed cessation/primary prevention of drug use and availability of treatment for abuse.   Diet: Encouraged to adjust caloric intake to maintain  or achieve ideal body weight, to reduce intake of dietary saturated fat and total fat, to limit sodium intake by avoiding high sodium foods and not adding table salt, and to maintain adequate dietary potassium and calcium preferably from fresh fruits, vegetables, and low-fat dairy products.    Stressed the importance of regular exercise  Injury prevention: Discussed  safety belts, safety helmets, smoke detector, smoking near bedding or upholstery.   Dental health: Discussed importance of regular tooth brushing, flossing, and dental visits.   Follow up plan: NEXT PREVENTATIVE PHYSICAL DUE IN 1 YEAR. Return in about 6 months (around 02/09/2023) for HLD AND OBESITY.

## 2022-08-09 NOTE — Assessment & Plan Note (Addendum)
Ongoing.  Last check 6.2%.  Continue diet focus at home and we discussed initiation of Ozempic in future if needed, he will look into coverage and alert provider.  Educated at length on these medications.

## 2022-08-09 NOTE — Assessment & Plan Note (Signed)
Chronic, ongoing, continue supplement daily and recheck level today.

## 2022-08-09 NOTE — Assessment & Plan Note (Signed)
Chronic, ongoing.  Continue 100% use of CPAP.

## 2022-08-09 NOTE — Assessment & Plan Note (Signed)
Refer to morbid obesity plan of care.

## 2022-08-09 NOTE — Assessment & Plan Note (Addendum)
Chronic, ongoing.  Continue focus on diet changes and continue Atorvastatin 10 MG daily, recheck lipid panel today and adjust medication as needed.  Refills up to date.

## 2022-08-09 NOTE — Assessment & Plan Note (Addendum)
Chronic, stable.  BP at goal in office today without medication, he is active.  Hiking often.  Recommend he monitor BP at least a few mornings a week at home and document.  DASH diet at home. Labs today: CBC, CMP, TSH, urine ALB.  Return in 6 months.

## 2022-08-09 NOTE — Assessment & Plan Note (Signed)
BMI 55.49.  Educated at length on these medications.  Recommended eating smaller high protein, low fat meals more frequently and exercising 30 mins a day 5 times a week with a goal of 10-15lb weight loss in the next 3 months. Patient voiced their understanding and motivation to adhere to these recommendations.

## 2022-08-10 LAB — CBC WITH DIFFERENTIAL/PLATELET
Basophils Absolute: 0.1 10*3/uL (ref 0.0–0.2)
Basos: 1 %
EOS (ABSOLUTE): 0.2 10*3/uL (ref 0.0–0.4)
Eos: 3 %
Hematocrit: 45.2 % (ref 37.5–51.0)
Hemoglobin: 15 g/dL (ref 13.0–17.7)
Immature Grans (Abs): 0.1 10*3/uL (ref 0.0–0.1)
Immature Granulocytes: 1 %
Lymphocytes Absolute: 2.4 10*3/uL (ref 0.7–3.1)
Lymphs: 30 %
MCH: 31.9 pg (ref 26.6–33.0)
MCHC: 33.2 g/dL (ref 31.5–35.7)
MCV: 96 fL (ref 79–97)
Monocytes Absolute: 0.7 10*3/uL (ref 0.1–0.9)
Monocytes: 8 %
Neutrophils Absolute: 4.5 10*3/uL (ref 1.4–7.0)
Neutrophils: 57 %
Platelets: 220 10*3/uL (ref 150–450)
RBC: 4.7 x10E6/uL (ref 4.14–5.80)
RDW: 11.8 % (ref 11.6–15.4)
WBC: 7.9 10*3/uL (ref 3.4–10.8)

## 2022-08-10 LAB — COMPREHENSIVE METABOLIC PANEL
ALT: 27 IU/L (ref 0–44)
AST: 25 IU/L (ref 0–40)
Albumin/Globulin Ratio: 2.2 (ref 1.2–2.2)
Albumin: 4.3 g/dL (ref 3.9–4.9)
Alkaline Phosphatase: 51 IU/L (ref 44–121)
BUN/Creatinine Ratio: 20 (ref 10–24)
BUN: 19 mg/dL (ref 8–27)
Bilirubin Total: 0.4 mg/dL (ref 0.0–1.2)
CO2: 21 mmol/L (ref 20–29)
Calcium: 9.5 mg/dL (ref 8.6–10.2)
Chloride: 105 mmol/L (ref 96–106)
Creatinine, Ser: 0.96 mg/dL (ref 0.76–1.27)
Globulin, Total: 2 g/dL (ref 1.5–4.5)
Glucose: 96 mg/dL (ref 70–99)
Potassium: 4.5 mmol/L (ref 3.5–5.2)
Sodium: 140 mmol/L (ref 134–144)
Total Protein: 6.3 g/dL (ref 6.0–8.5)
eGFR: 86 mL/min/{1.73_m2} (ref >59)

## 2022-08-10 LAB — TSH: TSH: 2.19 u[IU]/mL (ref 0.450–4.500)

## 2022-08-10 LAB — LIPID PANEL W/O CHOL/HDL RATIO
Cholesterol, Total: 137 mg/dL (ref 100–199)
HDL: 54 mg/dL (ref >39)
LDL Chol Calc (NIH): 71 mg/dL (ref 0–99)
Triglycerides: 57 mg/dL (ref 0–149)
VLDL Cholesterol Cal: 12 mg/dL (ref 5–40)

## 2022-08-10 LAB — HEMOGLOBIN A1C
Est. average glucose Bld gHb Est-mCnc: 126 mg/dL
Hgb A1c MFr Bld: 6 % — ABNORMAL HIGH (ref 4.8–5.6)

## 2022-08-10 LAB — VITAMIN D 25 HYDROXY (VIT D DEFICIENCY, FRACTURES): Vit D, 25-Hydroxy: 34.9 ng/mL (ref 30.0–100.0)

## 2022-08-10 LAB — PSA: Prostate Specific Ag, Serum: 0.9 ng/mL (ref 0.0–4.0)

## 2022-08-10 NOTE — Progress Notes (Signed)
Contacted via [place] afternoon [place], your labs have returned and overall continue to look fantastic with exception of A1c.  I recommend continue all current medications.  The A1c is the diabetes testing we talked about, this looks at your blood sugars over the past 3 months and turns the average into a number.  Your number is 6.0%, meaning you are prediabetic.  Any number 5.7 to 6.4 is considered prediabetes and any number 6.5 or greater is considered diabetes.   I would recommend heavy focus on decreasing foods high in sugar and your intake of things like bread products, pasta, and rice.  The American Diabetes Association online has a large amount of information on diet changes to make.  We will recheck this number in 6 months to ensure you are not continuing to trend upwards and move into diabetes.  Any questions? Keep being amazing!!  Thank you for allowing me to participate in your care.  I appreciate you. Kindest regards, Jaretssi Kraker

## 2022-08-17 ENCOUNTER — Telehealth: Payer: Self-pay | Admitting: Nurse Practitioner

## 2022-08-17 NOTE — Telephone Encounter (Signed)
[place] to schedule their annual wellness visit. Call back at later date: 11/06/2022  Sherol Dade; [facility] Direct Dial: [phone]

## 2022-12-06 DIAGNOSIS — H524 Presbyopia: Secondary | ICD-10-CM | POA: Diagnosis not present

## 2023-01-14 NOTE — Progress Notes (Unsigned)
[facility] [address] [place], [place] [address]  Pulmonary Sleep Medicine   Office Visit Note  Patient Name: Robert Rasmussen DOB: 24-Mar-1951 MRN [id number]    Chief Complaint: Obstructive Sleep Apnea visit  Brief History:  Brayton is seen today for an annual follow up visit for CPAP@ 11 cmH2O. The patient has a 8 history of sleep apnea. Patient is using PAP nightly.  The patient feels rested after sleeping with PAP.  The patient reports benefit from PAP use. Reported sleepiness is improved and the Epworth Sleepiness Score is 3 out of 24. The patient does not take naps. The patient complains of the following: needs new supplies. The compliance download shows 100% compliance with an average use time of 8 hours 28 minutes. The AHI is 0.4. The patient does not complain of limb movements disrupting sleep. The patient continues to require PAP therapy in order to eliminate sleep apnea.   ROS  General: (-) fever, (-) chills, (-) night sweat Nose and Sinuses: (-) nasal stuffiness or itchiness, (-) postnasal drip, (-) nosebleeds, (-) sinus trouble. Mouth and Throat: (-) sore throat, (-) hoarseness. Neck: (-) swollen glands, (-) enlarged thyroid, (-) neck pain. Respiratory: - cough, - shortness of breath, - wheezing. Neurologic: - numbness, - tingling. Psychiatric: - anxiety, - depression   Current Medication: Outpatient Encounter Medications as of 01/17/2023  Medication Sig   atorvastatin (LIPITOR) 10 MG tablet Take 1 tablet (10 mg total) by mouth daily.   Cholecalciferol (VITAMIN D3) 25 MCG (1000 UT) CAPS Take 1 capsule by mouth daily.   Multiple Vitamins-Minerals (ONE-A-DAY MENS 50+ ADVANTAGE) TABS Take 1 tablet by mouth daily.   No facility-administered encounter medications on file as of 01/17/2023.    Surgical History: Past Surgical History:  Procedure Laterality Date   TONSILLECTOMY      Medical History: Past Medical History:  Diagnosis Date   Allergy     seasonal   Amebic dysentery    Asthma    Cellulitis    GERD (gastroesophageal reflux disease)    Hyperlipidemia     Family History: Non contributory to the present illness  Social History: Social History   Socioeconomic History   Marital status: Divorced    Spouse name: Not on file   Number of children: Not on file   Years of education: Not on file   Highest education level: Not on file  Occupational History   Occupation: retired  Tobacco Use   Smoking status: Never   Smokeless tobacco: Never  Vaping Use   Vaping status: Never Used  Substance and Sexual Activity   Alcohol use: Not Currently   Drug use: No   Sexual activity: Yes  Other Topics Concern   Not on file  Social History Narrative   Not on file   Social Determinants of Health   Financial Resource Strain: Low Risk  (09/08/2021)   Overall Financial Resource Strain (CARDIA)    Difficulty of Paying Living Expenses: Not hard at all  Food Insecurity: No Food Insecurity (09/08/2021)   Hunger Vital Sign    Worried About Running Out of Food in the Last Year: Never true    Ran Out of Food in the Last Year: Never true  Transportation Needs: No Transportation Needs (09/08/2021)   PRAPARE - Administrator, Civil Service (Medical): No    Lack of Transportation (Non-Medical): No  Physical Activity: Insufficiently Active (09/08/2021)   Exercise Vital Sign    Days of Exercise per  Week: 3 days    Minutes of Exercise per Session: 30 min  Stress: No Stress Concern Present (09/08/2021)   [employer] of Occupational Health - Occupational Stress Questionnaire    Feeling of Stress : Not at all  Social Connections: Moderately Isolated (09/08/2021)   Social Connection and Isolation Panel [NHANES]    Frequency of Communication with Friends and Family: More than three times a week    Frequency of Social Gatherings with Friends and Family: Three times a week    Attends Religious Services: Never    Active Member of Clubs  or Organizations: Yes    Attends Banker Meetings: More than 4 times per year    Marital Status: Divorced  Intimate Partner Violence: Not At Risk (09/08/2021)   Humiliation, Afraid, Rape, and Kick questionnaire    Fear of Current or Ex-Partner: No    Emotionally Abused: No    Physically Abused: No    Sexually Abused: No    Vital Signs: There were no vitals taken for this visit. There is no height or weight on file to calculate BMI.    Examination: General Appearance: The patient is well-developed, well-nourished, and in no distress. Neck Circumference: 50.5cm Skin: Gross inspection of skin unremarkable. Head: normocephalic, no gross deformities. Eyes: no gross deformities noted. ENT: ears appear grossly normal Neurologic: Alert and oriented. No involuntary movements.  STOP BANG RISK ASSESSMENT S (snore) Have you been told that you snore?     No   T (tired) Are you often tired, fatigued, or sleepy during the day?   NO  O (obstruction) Do you stop breathing, choke, or gasp during sleep? NO   P (pressure) Do you have or are you being treated for high blood pressure? NO   B (BMI) Is your body index greater than 35 kg/m? YES   A (age) Are you 70 years old or older? YES   N (neck) Do you have a neck circumference greater than 16 inches?   YES   G (gender) Are you a male? YES   TOTAL STOP/BANG "YES" ANSWERS 4       A STOP-Bang score of 2 or less is considered low risk, and a score of 5 or more is high risk for having either moderate or severe OSA. For people who score 3 or 4, doctors may need to perform further assessment to determine how likely they are to have OSA.         EPWORTH SLEEPINESS SCALE:  Scale:  (0)= no chance of dozing; (1)= slight chance of dozing; (2)= moderate chance of dozing; (3)= high chance of dozing  Chance  Situtation    Sitting and reading: 0    Watching TV: 0    Sitting Inactive in public: 0    As a passenger in car: 1       Lying down to rest: 1    Sitting and talking: 0    Sitting quielty after lunch: 1    In a car, stopped in traffic: 0   TOTAL SCORE:   3 out of 24    SLEEP STUDIES:  SPLIT (03/23/15) AHI 103.9, min SPO2 85%, recommended APAP at 9-12 cm   CPAP COMPLIANCE DATA:  Date Range: 01/12/2022-01/11/2023  Average Daily Use: 8 hours 28 minutes  Median Use: 8 hours 29 minutes  Compliance for > 4 Hours: 100%  AHI: 0.4 respiratory events per hour  Days Used: 365/365 days  Mask Leak: 6.2  95th Percentile  Pressure: 11         LABS: No results found for this or any previous visit (from the past 2160 hour(s)).  Radiology: DG Chest 2 View  Result Date: 06/13/2017 CLINICAL DATA:  One month of nonproductive cough worrisome for bronchitis or pneumonia. Mild exertional shortness of breath. History of childhood asthma. Nonsmoker. EXAM: CHEST  2 VIEW COMPARISON:  None in PACs FINDINGS: The lungs are well-expanded. There is no focal infiltrate. The interstitial markings are prominent especially in the lower lobes. There is pleural thickening along the lateral thoracic cage bilaterally. There is no pleural effusion. The heart is top-normal in size. The pulmonary vascularity is normal. The bony thorax exhibits no acute abnormality. IMPRESSION: There is no pneumonia nor other acute cardiopulmonary abnormality. Mild interstitial prominence likely reflects bronchitic changes either acute or chronic. Electronically Signed   By: David  [place] M.D.   On: 06/13/2017 09:55    No results found.  No results found.    Assessment and Plan: Patient Active Problem List   Diagnosis Date Noted   Lower back pain 06/25/2021   Sebaceous cyst 10/27/2020   OSA on CPAP 06/16/2020   Prediabetes 02/29/2020   Chronic left shoulder pain 02/28/2020   BMI 50.0-59.9, adult (HCC) 10/04/2019   Mixed hyperlipidemia 08/01/2018   Vitamin D deficiency 07/30/2018   Benign hypertension 06/08/2017   Morbid  obesity (HCC) 03/10/2015   1. OSA on CPAP The patient does tolerate PAP and reports  benefit from PAP use. The patient was reminded how to clean equipment and advised to replace supplies routinely. The patient was also counselled on weight loss. The compliance is excellent. The AHI is 0.4.   OSA on cpap- controlled. Continue with excellent compliance with pap. CPAP continues to be medically necessary to treat this patient's OSA. F/u one year.    2. CPAP use counseling CPAP Counseling: had a lengthy discussion with the patient regarding the importance of PAP therapy in management of the sleep apnea. Patient appears to understand the risk factor reduction and also understands the risks associated with untreated sleep apnea. Patient will try to make a good faith effort to remain compliant with therapy. Also instructed the patient on proper cleaning of the device including the water must be changed daily if possible and use of distilled water is preferred. Patient understands that the machine should be regularly cleaned with appropriate recommended cleaning solutions that do not damage the PAP machine for example given white vinegar and water rinses. Other methods such as ozone treatment may not be as good as these simple methods to achieve cleaning.   3. Morbid obesity (HCC) Obesity Counseling: Had a lengthy discussion regarding patients BMI and weight issues. Patient was instructed on portion control as well as increased activity. Also discussed caloric restrictions with trying to maintain intake less than 2000 Kcal. Discussions were made in accordance with the 5As of weight management. Simple actions such as not eating late and if able to, taking a walk is suggested.      General Counseling: I have discussed the findings of the evaluation and examination with Ghazi.  I have also discussed any further diagnostic evaluation thatmay be needed or ordered today. Siaosi verbalizes understanding of the  findings of todays visit. We also reviewed his medications today and discussed drug interactions and side effects including but not limited excessive drowsiness and altered mental states. We also discussed that there is always a risk not just to him but also people around him.  he has been encouraged to call the office with any questions or concerns that should arise related to todays visit.  No orders of the defined types were placed in this encounter.       I have personally obtained a history, examined the patient, evaluated laboratory and imaging results, formulated the assessment and plan and placed orders. This patient was seen today by Emmaline Kluver, PA-C in collaboration with Dr. Freda Munro.   Yevonne Pax, MD [facility] Diplomate ABMS Pulmonary Critical Care Medicine and Sleep Medicine

## 2023-01-17 ENCOUNTER — Ambulatory Visit (INDEPENDENT_AMBULATORY_CARE_PROVIDER_SITE_OTHER): Payer: Medicare HMO | Admitting: Internal Medicine

## 2023-01-17 VITALS — BP 136/73 | HR 63 | Resp 16 | Ht 67.5 in | Wt 345.8 lb

## 2023-01-17 DIAGNOSIS — G4733 Obstructive sleep apnea (adult) (pediatric): Secondary | ICD-10-CM | POA: Diagnosis not present

## 2023-01-17 DIAGNOSIS — Z7189 Other specified counseling: Secondary | ICD-10-CM | POA: Diagnosis not present

## 2023-01-17 NOTE — Patient Instructions (Signed)

## 2023-02-07 NOTE — Patient Instructions (Signed)
Wegovy or Zepbound  Focus on DASH diet for high blood pressure or Mediterranean diet  Be Involved in Caring For Your Health:  Taking Medications When medications are taken as directed, they can greatly improve your health. But if they are not taken as prescribed, they may not work. In some cases, not taking them correctly can be harmful. To help ensure your treatment remains effective and safe, understand your medications and how to take them. Bring your medications to each visit for review by your provider.  Your lab results, notes, and after visit summary will be available on My Chart. We strongly encourage you to use this feature. If lab results are abnormal the clinic will contact you with the appropriate steps. If the clinic does not contact you assume the results are satisfactory. You can always view your results on My Chart. If you have questions regarding your health or results, please contact the clinic during office hours. You can also ask questions on My Chart.  We at [facility] are grateful that you chose us to provide your care. We strive to provide evidence-based and compassionate care and are always looking for feedback. If you get a survey from the clinic please complete this so we can hear your opinions.  Preventing High Cholesterol Cholesterol is a white, waxy substance similar to fat that the human body needs to help build cells. The liver makes all the cholesterol that a person's body needs. Having high cholesterol (hypercholesterolemia) increases your risk for heart disease and stroke. Extra or excess cholesterol comes from the food that you eat. High cholesterol can often be prevented with diet and lifestyle changes. If you already have high cholesterol, you can control it with diet, lifestyle changes, and medicines. How can high cholesterol affect me? If you have high cholesterol, fatty deposits (plaques) may build up on the walls of your blood vessels. The blood  vessels that carry blood away from your heart are called arteries. Plaques make the arteries narrower and stiffer. This in turn can: Restrict or block blood flow and cause blood clots to form. Increase your risk for heart attack and stroke. What can increase my risk for high cholesterol? This condition is more likely to develop in people who: Eat foods that are high in saturated fat or cholesterol. Saturated fat is mostly found in foods that come from animal sources. Are overweight. Are not getting enough exercise. Use products that contain nicotine or tobacco, such as cigarettes, e-cigarettes, and chewing tobacco. Have a family history of high cholesterol (familial hypercholesterolemia). What actions can I take to prevent this? Nutrition  Eat less saturated fat. Avoid trans fats (partially hydrogenated oils). These are often found in margarine and in some baked goods, fried foods, and snacks bought in packages. Avoid precooked or cured meat, such as bacon, sausages, or meat loaves. Avoid foods and drinks that have added sugars. Eat more fruits, vegetables, and whole grains. Choose healthy sources of protein, such as fish, poultry, lean cuts of red meat, beans, peas, lentils, and nuts. Choose healthy sources of fat, such as: Nuts. Vegetable oils, especially olive oil. Fish that have healthy fats, such as omega-3 fatty acids. These fish include mackerel or salmon. Lifestyle Lose weight if you are overweight. Maintaining a healthy body mass index (BMI) can help prevent or control high cholesterol. It can also lower your risk for diabetes and high blood pressure. Ask your health care provider to help you with a diet and exercise plan to lose   weight safely. Do not use any products that contain nicotine or tobacco. These products include cigarettes, chewing tobacco, and vaping devices, such as e-cigarettes. If you need help quitting, ask your health care provider. Alcohol use Do not drink  alcohol if: Your health care provider tells you not to drink. You are pregnant, may be pregnant, or are planning to become pregnant. If you drink alcohol: Limit how much you have to: 0-1 drink a day for women. 0-2 drinks a day for men. Know how much alcohol is in your drink. In the [place] one drink equals one 12 oz bottle of beer (355 mL), one 5 oz glass of wine (148 mL), or one 1 oz glass of hard liquor (44 mL). Activity  Get enough exercise. Do exercises as told by your health care provider. Each week, do at least 150 minutes of exercise that takes a medium level of effort (moderate-intensity exercise). This kind of exercise: Makes your heart beat faster while allowing you to still be able to talk. Can be done in short sessions several times a day or longer sessions a few times a week. For example, on 5 days each week, you could walk fast or ride your bike 3 times a day for 10 minutes each time. Medicines Your health care provider may recommend medicines to help lower cholesterol. This may be a medicine to lower the amount of cholesterol that your liver makes. You may need medicine if: Diet and lifestyle changes have not lowered your cholesterol enough. You have high cholesterol and other risk factors for heart disease or stroke. Take over-the-counter and prescription medicines only as told by your health care provider. General information Manage your risk factors for high cholesterol. Talk with your health care provider about all your risk factors and how to lower your risk. Manage other conditions that you have, such as diabetes or high blood pressure (hypertension). Have blood tests to check your cholesterol levels at regular points in time as told by your health care provider. Keep all follow-up visits. This is important. Where to find more information American Heart Association: www.heart.org National Heart, Lung, and Blood Institute: www.nhlbi.nih.gov Summary High cholesterol  increases your risk for heart disease and stroke. By keeping your cholesterol level low, you can reduce your risk for these conditions. High cholesterol can often be prevented with diet and lifestyle changes. Work with your health care provider to manage your risk factors, and have your blood tested regularly. This information is not intended to replace advice given to you by your health care provider. Make sure you discuss any questions you have with your health care provider. Document Revised: 12/25/2021 Document Reviewed: 07/28/2020 Elsevier Patient Education  2024 Elsevier Inc.  

## 2023-02-09 ENCOUNTER — Ambulatory Visit (INDEPENDENT_AMBULATORY_CARE_PROVIDER_SITE_OTHER): Payer: Medicare HMO | Admitting: Nurse Practitioner

## 2023-02-09 ENCOUNTER — Encounter: Payer: Self-pay | Admitting: Nurse Practitioner

## 2023-02-09 DIAGNOSIS — Z23 Encounter for immunization: Secondary | ICD-10-CM | POA: Diagnosis not present

## 2023-02-09 DIAGNOSIS — E782 Mixed hyperlipidemia: Secondary | ICD-10-CM

## 2023-02-09 DIAGNOSIS — G4733 Obstructive sleep apnea (adult) (pediatric): Secondary | ICD-10-CM

## 2023-02-09 DIAGNOSIS — I10 Essential (primary) hypertension: Secondary | ICD-10-CM

## 2023-02-09 DIAGNOSIS — R7303 Prediabetes: Secondary | ICD-10-CM

## 2023-02-09 DIAGNOSIS — Z6841 Body Mass Index (BMI) 40.0 and over, adult: Secondary | ICD-10-CM

## 2023-02-09 LAB — BAYER DCA HB A1C WAIVED: HB A1C (BAYER DCA - WAIVED): 5.8 % — ABNORMAL HIGH (ref 4.8–5.6)

## 2023-02-09 NOTE — Assessment & Plan Note (Signed)
Refer to morbid obesity plan of care. 

## 2023-02-09 NOTE — Assessment & Plan Note (Signed)
Chronic, ongoing.  Continue focus on diet changes and continue Atorvastatin 10 MG daily, recheck lipid panel today and adjust medication as needed.  Refills up to date.

## 2023-02-09 NOTE — Assessment & Plan Note (Signed)
Chronic, stable.  BP at goal in office today on recheck without medication  Hiking often.  Recommend he monitor BP at least a few mornings a week at home and document.  DASH diet at home. Labs today: CMP.  Return in 6 months.

## 2023-02-09 NOTE — Progress Notes (Signed)
BP 134/78 (BP Location: Left Arm, Patient Position: Sitting, Cuff Size: Large)   Pulse (!) 54   Temp 98.1 F (36.7 C) (Oral)   Ht 5' 7.2" (1.707 m)   Wt (!) 347 lb 6.4 oz (157.6 kg)   SpO2 95%   BMI 54.09 kg/m    Subjective:    Patient ID: Robert Rasmussen, male    DOB: 1950/11/10, 72 y.o.   MRN: [id number]  HPI: Robert Rasmussen is a 72 y.o. male  Chief Complaint  Patient presents with   Hyperlipidemia   Obesity   HYPERLIPIDEMIA Continues on Lipitor 10 MG daily.  Hyperlipidemia status: good compliance Satisfied with current treatment?  yes Side effects:  no Medication compliance: good compliance Supplements: none Aspirin:  no The 10-year ASCVD risk score (Arnett DK, et al., 2019) is: 15.7%   Values used to calculate the score:     Age: 89 years     Sex: Male     Is Non-Hispanic African American: No     Diabetic: No     Tobacco smoker: No     Systolic Blood Pressure: 134 mmHg     Is BP treated: No     HDL Cholesterol: 54 mg/dL     Total Cholesterol: 137 mg/dL Chest pain:  no Coronary artery disease:  no Family history CAD:  yes -- grandmother at 39 MI Family history early CAD:  no   Impaired Fasting Glucose HbA1C:  Lab Results  Component Value Date   HGBA1C 6.0 (H) 08/09/2022  Duration of elevated blood sugar: years Polydipsia: no Polyuria: no Weight change: no Visual disturbance: no Glucose Monitoring: no    Accucheck frequency: Not Checking    Fasting glucose:     Post prandial:  Diabetic Education: Not Completed Family history of diabetes: no   Relevant past medical, surgical, family and social history reviewed and updated as indicated. Interim medical history since our last visit reviewed. Allergies and medications reviewed and updated.  Review of Systems  Constitutional:  Negative for activity change, diaphoresis, fatigue and fever.  Respiratory:  Negative for cough, chest tightness, shortness of breath and wheezing.   Cardiovascular:  Negative for  chest pain, palpitations and leg swelling.  Gastrointestinal: Negative.   Neurological: Negative.   Psychiatric/Behavioral: Negative.     Per HPI unless specifically indicated above     Objective:    BP 134/78 (BP Location: Left Arm, Patient Position: Sitting, Cuff Size: Large)   Pulse (!) 54   Temp 98.1 F (36.7 C) (Oral)   Ht 5' 7.2" (1.707 m)   Wt (!) 347 lb 6.4 oz (157.6 kg)   SpO2 95%   BMI 54.09 kg/m   Wt Readings from Last 3 Encounters:  02/09/23 (!) 347 lb 6.4 oz (157.6 kg)  01/17/23 (!) 345 lb 12.8 oz (156.9 kg)  08/09/22 (!) 354 lb 6.4 oz (160.8 kg)    Physical Exam Vitals and nursing note reviewed.  Constitutional:      General: He is awake. He is not in acute distress.    Appearance: He is well-developed and well-groomed. He is obese. He is not ill-appearing or toxic-appearing.  HENT:     Head: Normocephalic and atraumatic.     Right Ear: Hearing normal. No drainage.     Left Ear: Hearing normal. No drainage.  Eyes:     General: Lids are normal.        Right eye: No discharge.  Left eye: No discharge.     Conjunctiva/sclera: Conjunctivae normal.     Pupils: Pupils are equal, round, and reactive to light.  Neck:     Thyroid: No thyromegaly.     Vascular: No carotid bruit.  Cardiovascular:     Rate and Rhythm: Normal rate and regular rhythm.     Heart sounds: Normal heart sounds, S1 normal and S2 normal. No murmur heard.    No gallop.  Pulmonary:     Effort: Pulmonary effort is normal. No accessory muscle usage or respiratory distress.     Breath sounds: Normal breath sounds.  Abdominal:     General: Bowel sounds are normal.     Palpations: Abdomen is soft.  Musculoskeletal:        General: Normal range of motion.     Cervical back: Normal range of motion and neck supple.     Right lower leg: No edema.     Left lower leg: No edema.  Skin:    General: Skin is warm and dry.     Capillary Refill: Capillary refill takes less than 2 seconds.   Neurological:     Mental Status: He is alert and oriented to person, place, and time.     Deep Tendon Reflexes: Reflexes are normal and symmetric.  Psychiatric:        Attention and Perception: Attention normal.        Mood and Affect: Mood normal.        Speech: Speech normal.        Behavior: Behavior normal. Behavior is cooperative.        Thought Content: Thought content normal.    Results for orders placed or performed in visit on 08/09/22  CBC with Differential/Platelet  Result Value Ref Range   WBC 7.9 3.4 - 10.8 x10E3/uL   RBC 4.70 4.14 - 5.80 x10E6/uL   Hemoglobin 15.0 13.0 - 17.7 g/dL   Hematocrit 56.3 87.5 - 51.0 %   MCV 96 79 - 97 fL   MCH 31.9 26.6 - 33.0 pg   MCHC 33.2 31.5 - 35.7 g/dL   RDW 64.3 32.9 - 51.8 %   Platelets 220 150 - 450 x10E3/uL   Neutrophils 57 Not Estab. %   Lymphs 30 Not Estab. %   Monocytes 8 Not Estab. %   Eos 3 Not Estab. %   Basos 1 Not Estab. %   Neutrophils Absolute 4.5 1.4 - 7.0 x10E3/uL   Lymphocytes Absolute 2.4 0.7 - 3.1 x10E3/uL   Monocytes Absolute 0.7 0.1 - 0.9 x10E3/uL   EOS (ABSOLUTE) 0.2 0.0 - 0.4 x10E3/uL   Basophils Absolute 0.1 0.0 - 0.2 x10E3/uL   Immature Granulocytes 1 Not Estab. %   Immature Grans (Abs) 0.1 0.0 - 0.1 x10E3/uL  Comprehensive metabolic panel  Result Value Ref Range   Glucose 96 70 - 99 mg/dL   BUN 19 8 - 27 mg/dL   Creatinine, Ser 8.41 0.76 - 1.27 mg/dL   eGFR 86 >66 AY/TKZ/6.01   BUN/Creatinine Ratio 20 10 - 24   Sodium 140 134 - 144 mmol/L   Potassium 4.5 3.5 - 5.2 mmol/L   Chloride 105 96 - 106 mmol/L   CO2 21 20 - 29 mmol/L   Calcium 9.5 8.6 - 10.2 mg/dL   Total Protein 6.3 6.0 - 8.5 g/dL   Albumin 4.3 3.9 - 4.9 g/dL   Globulin, Total 2.0 1.5 - 4.5 g/dL   Albumin/Globulin Ratio 2.2 1.2 - 2.2  Bilirubin Total 0.4 0.0 - 1.2 mg/dL   Alkaline Phosphatase 51 44 - 121 IU/L   AST 25 0 - 40 IU/L   ALT 27 0 - 44 IU/L  Lipid Panel w/o Chol/HDL Ratio  Result Value Ref Range   Cholesterol, Total  137 100 - 199 mg/dL   Triglycerides 57 0 - 149 mg/dL   HDL 54 >40 mg/dL   VLDL Cholesterol Cal 12 5 - 40 mg/dL   LDL Chol Calc (NIH) 71 0 - 99 mg/dL  TSH  Result Value Ref Range   TSH 2.190 0.450 - 4.500 uIU/mL  PSA  Result Value Ref Range   Prostate Specific Ag, Serum 0.9 0.0 - 4.0 ng/mL  VITAMIN D 25 Hydroxy (Vit-D Deficiency, Fractures)  Result Value Ref Range   Vit D, 25-Hydroxy 34.9 30.0 - 100.0 ng/mL  HgB A1c  Result Value Ref Range   Hgb A1c MFr Bld 6.0 (H) 4.8 - 5.6 %   Est. average glucose Bld gHb Est-mCnc 126 mg/dL  Microalbumin, Urine Waived  Result Value Ref Range   Microalb, Ur Waived 10 0 - 19 mg/L   Creatinine, Urine Waived 50 10 - 300 mg/dL   Microalb/Creat Ratio 30-300 (H) <30 mg/g      Assessment & Plan:   Problem List Items Addressed This Visit       Cardiovascular and Mediastinum   Benign hypertension    Chronic, stable.  BP at goal in office today on recheck without medication  Hiking often.  Recommend he monitor BP at least a few mornings a week at home and document.  DASH diet at home. Labs today: CMP.  Return in 6 months.       Relevant Orders   Comprehensive metabolic panel     Other   BMI 50.0-59.9, adult (HCC)    Refer to morbid obesity plan of care.       Mixed hyperlipidemia    Chronic, ongoing.  Continue focus on diet changes and continue Atorvastatin 10 MG daily, recheck lipid panel today and adjust medication as needed.  Refills up to date.      Relevant Orders   Comprehensive metabolic panel   Lipid Panel w/o Chol/HDL Ratio   Morbid obesity (HCC) - Primary    BMI 54.09.  Educated at length on these medications.  Recommended eating smaller high protein, low fat meals more frequently and exercising 30 mins a day 5 times a week with a goal of 10-15lb weight loss in the next 3 months. Patient voiced their understanding and motivation to adhere to these recommendations.       Prediabetes    Ongoing.  Last check 6%, will recheck  today.  Continue diet focus at home and exercise, unable to get coverage for GLP1.  Educated at length on these medications.      Relevant Orders   Bayer DCA Hb A1c Waived   Other Visit Diagnoses     Flu vaccine need       Flu vaccine provided in office today.   Relevant Orders   Flu Vaccine Trivalent High Dose (Fluad) (Completed)        Follow up plan: Return in about 6 months (around 08/09/2023) for Annual physical after 08/09/23 and needs nurse visit for Medicare Wellness please, past due.

## 2023-02-09 NOTE — Assessment & Plan Note (Signed)
Ongoing.  Last check 6%, will recheck today.  Continue diet focus at home and exercise, unable to get coverage for GLP1.  Educated at length on these medications.

## 2023-02-09 NOTE — Assessment & Plan Note (Signed)
BMI 54.09.  Educated at length on these medications.  Recommended eating smaller high protein, low fat meals more frequently and exercising 30 mins a day 5 times a week with a goal of 10-15lb weight loss in the next 3 months. Patient voiced their understanding and motivation to adhere to these recommendations.

## 2023-02-10 ENCOUNTER — Other Ambulatory Visit: Payer: Self-pay | Admitting: Nurse Practitioner

## 2023-02-10 LAB — COMPREHENSIVE METABOLIC PANEL
ALT: 26 IU/L (ref 0–44)
AST: 23 IU/L (ref 0–40)
Albumin: 4.1 g/dL (ref 3.9–4.9)
Alkaline Phosphatase: 50 IU/L (ref 44–121)
BUN/Creatinine Ratio: 18 (ref 10–24)
BUN: 15 mg/dL (ref 8–27)
Bilirubin Total: 0.8 mg/dL (ref 0.0–1.2)
CO2: 21 mmol/L (ref 20–29)
Calcium: 9.1 mg/dL (ref 8.6–10.2)
Chloride: 107 mmol/L — ABNORMAL HIGH (ref 96–106)
Creatinine, Ser: 0.83 mg/dL (ref 0.76–1.27)
Globulin, Total: 2.1 g/dL (ref 1.5–4.5)
Glucose: 107 mg/dL — ABNORMAL HIGH (ref 70–99)
Potassium: 4.5 mmol/L (ref 3.5–5.2)
Sodium: 141 mmol/L (ref 134–144)
Total Protein: 6.2 g/dL (ref 6.0–8.5)
eGFR: 94 mL/min/{1.73_m2} (ref >59)

## 2023-02-10 LAB — LIPID PANEL W/O CHOL/HDL RATIO
Cholesterol, Total: 136 mg/dL (ref 100–199)
HDL: 50 mg/dL (ref >39)
LDL Chol Calc (NIH): 71 mg/dL (ref 0–99)
Triglycerides: 76 mg/dL (ref 0–149)
VLDL Cholesterol Cal: 15 mg/dL (ref 5–40)

## 2023-02-10 NOTE — Progress Notes (Signed)
Contacted via MyChart   Good afternoon Robert Rasmussen, your labs have returned and overall remain stable.  No medication changes needed.  Any questions? Keep being awesome!!  Thank you for allowing me to participate in your care.  I appreciate you. Kindest regards, Delfin Squillace

## 2023-02-11 NOTE — Telephone Encounter (Signed)
Requested Prescriptions  Pending Prescriptions Disp Refills   atorvastatin (LIPITOR) 10 MG tablet [Pharmacy Med Name: ATORVASTATIN 10 MG TABLET] 90 tablet 0    Sig: TAKE 1 TABLET BY MOUTH EVERY DAY     Cardiovascular:  Antilipid - Statins Failed - 02/10/2023  2:55 AM      Failed - Lipid Panel in normal range within the last 12 months    Cholesterol, Total  Date Value Ref Range Status  02/09/2023 136 100 - 199 mg/dL Final   LDL Chol Calc (NIH)  Date Value Ref Range Status  02/09/2023 71 0 - 99 mg/dL Final   HDL  Date Value Ref Range Status  02/09/2023 50 >39 mg/dL Final   Triglycerides  Date Value Ref Range Status  02/09/2023 76 0 - 149 mg/dL Final         Passed - Patient is not pregnant      Passed - Valid encounter within last 12 months    Recent Outpatient Visits           2 days ago Morbid obesity (HCC)   [place] [facility] [place], Corrie Dandy T, NP   6 months ago Morbid obesity (HCC)   [place] [facility] [place], Corrie Dandy T, NP   1 year ago Morbid obesity (HCC)   [place] [facility] [place], Corrie Dandy T, NP   1 year ago Morbid obesity (HCC)   [place] [facility] [place], Corrie Dandy T, NP   2 years ago Morbid obesity [facility])   [place] [facility] [place], Dorie Rank, NP

## 2023-03-29 ENCOUNTER — Ambulatory Visit: Payer: Medicare HMO

## 2023-03-29 VITALS — Ht 67.0 in | Wt 338.0 lb

## 2023-03-29 DIAGNOSIS — Z0000 Encounter for general adult medical examination without abnormal findings: Secondary | ICD-10-CM

## 2023-03-29 NOTE — Progress Notes (Signed)
Subjective:   Robert Rasmussen is a 72 y.o. male who presents for Medicare Annual/Subsequent preventive examination.  Visit Complete: Virtual I connected with  Charmian Muff on 03/29/23 by a audio enabled telemedicine application and verified that I am speaking with the correct person using two identifiers.  Patient Location: Home  Provider Location: Home Office  I discussed the limitations of evaluation and management by telemedicine. The patient expressed understanding and agreed to proceed.  Vital Signs: Because this visit was a virtual/telehealth visit, some criteria may be missing or patient reported. Any vitals not documented were not able to be obtained and vitals that have been documented are patient reported.  Patient Medicare AWV questionnaire was completed by the patient on 03/25/23 I have confirmed that all information answered by patient is correct and no changes since this date.  Cardiac Risk Factors include: advanced age (>54men, >69 women);male gender;hypertension;obesity (BMI >30kg/m2)     Objective:    Today's Vitals   03/25/23 1712 03/29/23 1530  Weight:  (!) 338 lb (153.3 kg)  Height:  5\' 7"  (1.702 m)  PainSc: 0-No pain    Body mass index is 52.94 kg/m.     03/29/2023    3:36 PM 09/08/2021   10:32 AM 08/25/2020    1:45 PM 09/10/2019    2:22 PM  Advanced Directives  Does Patient Have a Medical Advance Directive? Yes Yes Yes Yes  Type of Estate agent of Mutual;Living will Healthcare Power of [employer] of [place];Living will Living will;Healthcare Power of Attorney  Copy of Healthcare Power of Attorney in Chart? No - copy requested No - copy requested No - copy requested No - copy requested    Current Medications (verified) Outpatient Encounter Medications as of 03/29/2023  Medication Sig   atorvastatin (LIPITOR) 10 MG tablet TAKE 1 TABLET BY MOUTH EVERY DAY   Cholecalciferol (VITAMIN D3) 25 MCG (1000 UT) CAPS Take 1  capsule by mouth daily.   Multiple Vitamins-Minerals (ONE-A-DAY MENS 50+ ADVANTAGE) TABS Take 1 tablet by mouth daily.   No facility-administered encounter medications on file as of 03/29/2023.    Allergies (verified) Patient has no known allergies.   History: Past Medical History:  Diagnosis Date   Allergy    seasonal   Amebic dysentery    Asthma    Cellulitis    GERD (gastroesophageal reflux disease)    Hyperlipidemia    Past Surgical History:  Procedure Laterality Date   TONSILLECTOMY     Family History  Problem Relation Age of Onset   Cancer Mother        breast   Breast cancer Mother    Cancer Father        skin   Skin cancer Father    Cancer Sister        breast   Breast cancer Sister    Heart disease Paternal Grandmother        heart attack   Prostate cancer Neg Hx    Colon cancer Neg Hx    Social History   Socioeconomic History   Marital status: Divorced    Spouse name: Not on file   Number of children: Not on file   Years of education: Not on file   Highest education level: Not on file  Occupational History   Occupation: retired  Tobacco Use   Smoking status: Never   Smokeless tobacco: Never  Vaping Use   Vaping status: Never Used  Substance and Sexual  Activity   Alcohol use: Not Currently   Drug use: No   Sexual activity: Yes  Other Topics Concern   Not on file  Social History Narrative   Not on file   Social Determinants of Health   Financial Resource Strain: Low Risk  (03/25/2023)   Overall Financial Resource Strain (CARDIA)    Difficulty of Paying Living Expenses: Not hard at all  Food Insecurity: No Food Insecurity (03/25/2023)   Hunger Vital Sign    Worried About Running Out of Food in the Last Year: Never true    Ran Out of Food in the Last Year: Never true  Transportation Needs: No Transportation Needs (03/25/2023)   PRAPARE - Administrator, Civil Service (Medical): No    Lack of Transportation (Non-Medical): No   Physical Activity: Sufficiently Active (03/25/2023)   Exercise Vital Sign    Days of Exercise per Week: 4 days    Minutes of Exercise per Session: 50 min  Stress: No Stress Concern Present (03/25/2023)   [employer] of Occupational Health - Occupational Stress Questionnaire    Feeling of Stress : Not at all  Social Connections: Unknown (03/25/2023)   Social Connection and Isolation Panel [NHANES]    Frequency of Communication with Friends and Family: Twice a week    Frequency of Social Gatherings with Friends and Family: Three times a week    Attends Religious Services: Not on file    Active Member of Clubs or Organizations: Yes    Attends Banker Meetings: More than 4 times per year    Marital Status: Divorced    Tobacco Counseling Counseling given: Not Answered   Clinical Intake:  Pre-visit preparation completed: Yes  Pain : No/denies pain Pain Score: 0-No pain     BMI - recorded: 52.94 Nutritional Status: BMI > 30  Obese Nutritional Risks: None Diabetes: No  How often do you need to have someone help you when you read instructions, pamphlets, or other written materials from your doctor or pharmacy?: 1 - Never  Interpreter Needed?: No  Information entered by :: Theresa Mulligan LPN   Activities of Daily Living    03/29/2023    3:35 PM 03/25/2023    5:12 PM  In your present state of health, do you have any difficulty performing the following activities:  Hearing? 0 0  Vision? 0 0  Difficulty concentrating or making decisions? 0 0  Walking or climbing stairs? 0 0  Dressing or bathing? 0 0  Doing errands, shopping? 0 0  Preparing Food and eating ? N N  Using the Toilet? N N  In the past six months, have you accidently leaked urine? N N  Do you have problems with loss of bowel control? N N  Managing your Medications? N N  Managing your Finances? N N  Housekeeping or managing your Housekeeping? N N    Patient Care Team: Marjie Skiff, NP as PCP - General (Nurse Practitioner)  Indicate any recent Medical Services you may have received from other than [facility] providers in the past year (date may be approximate).     Assessment:   This is a routine wellness examination for Robert Rasmussen.  Hearing/Vision screen Hearing Screening - Comments:: Denies hearing difficulties   Vision Screening - Comments:: Wears rx glasses - up to date with routine eye exams with  [facility]   Goals Addressed  This Visit's Progress     Increase physical activity (pt-stated)         Depression Screen    03/29/2023    3:40 PM 02/09/2023    1:13 PM 08/09/2022    1:38 PM 01/12/2022   10:28 AM 09/08/2021   10:37 AM 06/25/2021   10:47 AM 08/25/2020    1:46 PM  PHQ 2/9 Scores  PHQ - 2 Score 0 0 0 0 0 0 0  PHQ- 9 Score 0 0 1 2  1      Fall Risk    03/29/2023    3:36 PM 03/25/2023    5:12 PM 02/09/2023    1:13 PM 08/09/2022    1:38 PM 06/25/2021   10:41 AM  Fall Risk   Falls in the past year? 0 0 0 0 0  Number falls in past yr: 0 0 0 0 0  Injury with Fall? 0  0 0 0  Risk for fall due to : No Fall Risks  No Fall Risks No Fall Risks No Fall Risks  Follow up Falls prevention discussed  Falls evaluation completed Falls evaluation completed Falls evaluation completed    MEDICARE RISK AT HOME: Medicare Risk at Home Any stairs in or around the home?: Yes If so, are there any without handrails?: No Home free of loose throw rugs in walkways, pet beds, electrical cords, etc?: Yes Adequate lighting in your home to reduce risk of falls?: Yes Life alert?: No Use of a cane, walker or w/c?: No Grab bars in the bathroom?: No Shower chair or bench in shower?: Yes Elevated toilet seat or a handicapped toilet?: No  TIMED UP AND GO:  Was the test performed?  No    Cognitive Function:        03/29/2023    3:36 PM 09/08/2021   10:32 AM 08/25/2020    1:47 PM  6CIT Screen  What Year? 0 points  0 points  What month? 0 points  0  points  What time? 0 points 0 points 0 points  Count back from 20 0 points 0 points 0 points  Months in reverse 0 points 0 points 0 points  Repeat phrase 0 points 0 points 0 points  Total Score 0 points  0 points    Immunizations Immunization History  Administered Date(s) Administered   Fluad Quad(high Dose 65+) 02/26/2019, 02/28/2020   Fluad Trivalent(High Dose 65+) 02/09/2023   Influenza, High Dose Seasonal PF 03/13/2018   Influenza,inj,Quad PF,6+ Mos 03/10/2015, 07/14/2016, 07/19/2017   Influenza-Unspecified 05/22/2021, 05/14/2022   PFIZER(Purple Top)SARS-COV-2 Vaccination 07/16/2019, 08/09/2019, 03/25/2020, 11/28/2020, 05/14/2021   Pfizer Covid-19 Vaccine Bivalent Booster 25yrs & up 05/14/2022   Pneumococcal Conjugate-13 03/13/2018   Pneumococcal Polysaccharide-23 02/26/2019   Respiratory Syncytial Virus Vaccine,Recomb Aduvanted(Arexvy) 05/25/2022   Td 08/23/2007   Tdap 07/14/2016   Zoster Recombinant(Shingrix) 02/12/2020, 07/15/2021   Zoster, Live 03/23/2013    TDAP status: Up to date  Flu Vaccine status: Up to date  Pneumococcal vaccine status: Up to date  Covid-19 vaccine status: Declined, Education has been provided regarding the importance of this vaccine but patient still declined. Advised may receive this vaccine at local pharmacy or Health Dept.or vaccine clinic. Aware to provide a copy of the vaccination record if obtained from local pharmacy or Health Dept. Verbalized acceptance and understanding.  Qualifies for Shingles Vaccine? Yes   Zostavax completed Yes   Shingrix Completed?: Yes  Screening Tests Health Maintenance  Topic Date Due   COVID-19 Vaccine (7 -  2023-24 season) 02/06/2023   Fecal DNA (Cologuard)  07/22/2023   Medicare Annual Wellness (AWV)  03/28/2024   DTaP/Tdap/Td (3 - Td or Tdap) 07/14/2026   Pneumonia Vaccine 81+ Years old  Completed   INFLUENZA VACCINE  Completed   Hepatitis C Screening  Completed   Zoster Vaccines- Shingrix  Completed    HPV VACCINES  Aged Out    Health Maintenance  Health Maintenance Due  Topic Date Due   COVID-19 Vaccine (7 - 2023-24 season) 02/06/2023    Colorectal cancer screening: Type of screening: Cologuard. Completed 07/21/20. Repeat every 3 years    Additional Screening:  Hepatitis C Screening: does qualify; Completed 03/10/15  Vision Screening: Recommended annual ophthalmology exams for early detection of glaucoma and other disorders of the eye. Is the patient up to date with their annual eye exam?  Yes  Who is the provider or what is the name of the office in which the patient attends annual eye exams? [facility] If pt is not established with a provider, would they like to be referred to a provider to establish care? No .   Dental Screening: Recommended annual dental exams for proper oral hygiene    Community Resource Referral / Chronic Care Management:  CRR required this visit?  No   CCM required this visit?  No     Plan:     I have personally reviewed and noted the following in the patient's chart:   Medical and social history Use of alcohol, tobacco or illicit drugs  Current medications and supplements including opioid prescriptions. Patient is not currently taking opioid prescriptions. Functional ability and status Nutritional status Physical activity Advanced directives List of other physicians Hospitalizations, surgeries, and ER visits in previous 12 months Vitals Screenings to include cognitive, depression, and falls Referrals and appointments  In addition, I have reviewed and discussed with patient certain preventive protocols, quality metrics, and best practice recommendations. A written personalized care plan for preventive services as well as general preventive health recommendations were provided to patient.     Tillie Rung, LPN   [id number]   After Visit Summary: (MyChart) Due to this being a telephonic visit, the after visit summary  with patients personalized plan was offered to patient via MyChart    Nurse Notes: None

## 2023-03-29 NOTE — Patient Instructions (Addendum)
Robert Rasmussen , Thank you for taking time to come for your Medicare Wellness Visit. I appreciate your ongoing commitment to your health goals. Please review the following plan we discussed and let me know if I can assist you in the future.   Referrals/Orders/Follow-Ups/Clinician Recommendations:   This is a list of the screening recommended for you and due dates:  Health Maintenance  Topic Date Due   COVID-19 Vaccine (7 - 2023-24 season) 02/06/2023   Cologuard (Stool DNA test)  07/22/2023   Medicare Annual Wellness Visit  03/28/2024   DTaP/Tdap/Td vaccine (3 - Td or Tdap) 07/14/2026   Pneumonia Vaccine  Completed   Flu Shot  Completed   Hepatitis C Screening  Completed   Zoster (Shingles) Vaccine  Completed   HPV Vaccine  Aged Out    Advanced directives: (Copy Requested) Please bring a copy of your health care power of attorney and living will to the office to be added to your chart at your convenience.  Next Medicare Annual Wellness Visit scheduled for next year: Yes

## 2023-05-06 ENCOUNTER — Other Ambulatory Visit: Payer: Self-pay | Admitting: Nurse Practitioner

## 2023-05-09 NOTE — Telephone Encounter (Signed)
Requested Prescriptions  Pending Prescriptions Disp Refills   atorvastatin (LIPITOR) 10 MG tablet [Pharmacy Med Name: ATORVASTATIN 10 MG TABLET] 90 tablet 0    Sig: TAKE 1 TABLET BY MOUTH EVERY DAY     Cardiovascular:  Antilipid - Statins Failed - 05/06/2023  3:19 AM      Failed - Lipid Panel in normal range within the last 12 months    Cholesterol, Total  Date Value Ref Range Status  02/09/2023 136 100 - 199 mg/dL Final   LDL Chol Calc (NIH)  Date Value Ref Range Status  02/09/2023 71 0 - 99 mg/dL Final   HDL  Date Value Ref Range Status  02/09/2023 50 >39 mg/dL Final   Triglycerides  Date Value Ref Range Status  02/09/2023 76 0 - 149 mg/dL Final         Passed - Patient is not pregnant      Passed - Valid encounter within last 12 months    Recent Outpatient Visits           2 months ago Morbid obesity (HCC)   [place] [facility] [place], [place] T, NP   9 months ago Morbid obesity (HCC)   [place] [facility] [place], Corrie Dandy T, NP   1 year ago Morbid obesity (HCC)   [place] [facility] [place], Corrie Dandy T, NP   1 year ago Morbid obesity (HCC)   [place] [facility] [place], Corrie Dandy T, NP   2 years ago Morbid obesity [facility])   [place] [facility] [place], Dorie Rank, NP

## 2023-09-13 ENCOUNTER — Encounter: Payer: Medicare HMO | Admitting: Nurse Practitioner

## 2024-01-13 NOTE — Progress Notes (Signed)
 [facility] [address] [place], [place] [address]  Pulmonary Sleep Medicine   Office Visit Note  Patient Name: Robert Rasmussen DOB: [birth year] MRN [id number]    Chief Complaint: Obstructive Sleep Apnea visit  Brief History:  Robert Rasmussen is seen today for an annual follow up visit for CPAP@ 11 cmH2O. The patient has a 9 year history of sleep apnea. Patient is using PAP nightly.  The patient feels rested after sleeping with PAP.  The patient reports benefiting from PAP use. Reported sleepiness is  improved and the Epworth Sleepiness Score is 1 out of 24. The patient does not take naps. The patient complains of the following: occasional bloating and belching in the am.  The compliance download shows 100% compliance with an average use time of 8 hours 28 minutes. The AHI is 0.3.  The patient does not complain of limb movements disrupting sleep. The patient continues to require PAP therapy in order to eliminate sleep apnea.  ROS  General: (-) fever, (-) chills, (-) night sweat Nose and Sinuses: (-) nasal stuffiness or itchiness, (-) postnasal drip, (-) nosebleeds, (-) sinus trouble. Mouth and Throat: (-) sore throat, (-) hoarseness. Neck: (-) swollen glands, (-) enlarged thyroid , (-) neck pain. Respiratory: - cough, - shortness of breath, - wheezing. Neurologic: - numbness, - tingling. Psychiatric: - anxiety, - depression   Current Medication: Outpatient Encounter Medications as of 01/16/2024  Medication Sig   atorvastatin  (LIPITOR) 10 MG tablet TAKE 1 TABLET BY MOUTH EVERY DAY   Cholecalciferol (VITAMIN D3) 25 MCG (1000 UT) CAPS Take 1 capsule by mouth daily.   Multiple Vitamins-Minerals (ONE-A-DAY MENS 50+ ADVANTAGE) TABS Take 1 tablet by mouth daily.   No facility-administered encounter medications on file as of 01/16/2024.    Surgical History: Past Surgical History:  Procedure Laterality Date   TONSILLECTOMY      Medical History: Past Medical History:  Diagnosis  Date   Allergy    seasonal   Amebic dysentery    Asthma    Cellulitis    GERD (gastroesophageal reflux disease)    Hyperlipidemia     Family History: Non contributory to the present illness  Social History: Social History   Socioeconomic History   Marital status: Divorced    Spouse name: Not on file   Number of children: Not on file   Years of education: Not on file   Highest education level: Not on file  Occupational History   Occupation: retired  Tobacco Use   Smoking status: Never   Smokeless tobacco: Never  Vaping Use   Vaping status: Never Used  Substance and Sexual Activity   Alcohol use: Not Currently   Drug use: No   Sexual activity: Yes  Other Topics Concern   Not on file  Social History Narrative   Not on file   Social Drivers of Health   Financial Resource Strain: Low Risk  (03/25/2023)   Overall Financial Resource Strain (CARDIA)    Difficulty of Paying Living Expenses: Not hard at all  Food Insecurity: No Food Insecurity (03/25/2023)   Hunger Vital Sign    Worried About Running Out of Food in the Last Year: Never true    Ran Out of Food in the Last Year: Never true  Transportation Needs: No Transportation Needs (03/25/2023)   PRAPARE - Administrator, Civil Service (Medical): No    Lack of Transportation (Non-Medical): No  Physical Activity: Sufficiently Active (03/25/2023)   Exercise Vital Sign  Days of Exercise per Week: 4 days    Minutes of Exercise per Session: 50 min  Stress: No Stress Concern Present (03/25/2023)   [employer] of Occupational Health - Occupational Stress Questionnaire    Feeling of Stress : Not at all  Social Connections: Unknown (03/25/2023)   Social Connection and Isolation Panel    Frequency of Communication with Friends and Family: Twice a week    Frequency of Social Gatherings with Friends and Family: Three times a week    Attends Religious Services: Not on file    Active Member of Clubs or  Organizations: Yes    Attends Club or Organization Meetings: More than 4 times per year    Marital Status: Divorced  Intimate Partner Violence: Not At Risk (03/29/2023)   Humiliation, Afraid, Rape, and Kick questionnaire    Fear of Current or Ex-Partner: No    Emotionally Abused: No    Physically Abused: No    Sexually Abused: No    Vital Signs: Blood pressure (!) 143/87, pulse 68, resp. rate 16, height 5' 7 (1.702 m), weight (!) 350 lb (158.8 kg), SpO2 97%. Body mass index is 54.82 kg/m.    Examination: General Appearance: The patient is well-developed, well-nourished, and in no distress. Neck Circumference: 50 cm Skin: Gross inspection of skin unremarkable. Head: normocephalic, no gross deformities. Eyes: no gross deformities noted. ENT: ears appear grossly normal Neurologic: Alert and oriented. No involuntary movements.  STOP BANG RISK ASSESSMENT S (snore) Have you been told that you snore?     NO   T (tired) Are you often tired, fatigued, or sleepy during the day?   NO  O (obstruction) Do you stop breathing, choke, or gasp during sleep? NO   P (pressure) Do you have or are you being treated for high blood pressure? NO   B (BMI) Is your body index greater than 35 kg/m? YES   A (age) Are you 30 years old or older? YES   N (neck) Do you have a neck circumference greater than 16 inches?   YES   G (gender) Are you a male? YES   TOTAL STOP/BANG "YES" ANSWERS 4       A STOP-Bang score of 2 or less is considered low risk, and a score of 5 or more is high risk for having either moderate or severe OSA. For people who score 3 or 4, doctors may need to perform further assessment to determine how likely they are to have OSA.         EPWORTH SLEEPINESS SCALE:  Scale:  (0)= no chance of dozing; (1)= slight chance of dozing; (2)= moderate chance of dozing; (3)= high chance of dozing  Chance  Situtation    Sitting and reading: 0    Watching TV: 0    Sitting Inactive in  public: 0    As a passenger in car: 0      Lying down to rest: 1    Sitting and talking: 0    Sitting quielty after lunch: 0    In a car, stopped in traffic: 0   TOTAL SCORE:   1 out of 24    SLEEP STUDIES:  SPLIT (03/23/15) AHI 103.9, min SPO2 85%, recommended APAP at 9-12 cm    CPAP COMPLIANCE DATA:  Date Range: 01/13/2023-01/12/2024  Average Daily Use: 8 hours 28 minutes  Median Use: 8 hours 30 minutes  Compliance for > 4 Hours: 100%  AHI: 0.3 respiratory events per hour  Days Used: 365/365 days  Mask Leak: 5.6  95th Percentile Pressure: 0.3         LABS: No results found for this or any previous visit (from the past 2160 hours).  Radiology: DG Chest 2 View Result Date: 06/13/2017 CLINICAL DATA:  One month of nonproductive cough worrisome for bronchitis or pneumonia. Mild exertional shortness of breath. History of childhood asthma. Nonsmoker. EXAM: CHEST  2 VIEW COMPARISON:  None in PACs FINDINGS: The lungs are well-expanded. There is no focal infiltrate. The interstitial markings are prominent especially in the lower lobes. There is pleural thickening along the lateral thoracic cage bilaterally. There is no pleural effusion. The heart is top-normal in size. The pulmonary vascularity is normal. The bony thorax exhibits no acute abnormality. IMPRESSION: There is no pneumonia nor other acute cardiopulmonary abnormality. Mild interstitial prominence likely reflects bronchitic changes either acute or chronic. Electronically Signed   By: David  [place] M.D.   On: 06/13/2017 09:55    No results found.  No results found.    Assessment and Plan: Patient Active Problem List   Diagnosis Date Noted   Lower back pain 06/25/2021   Sebaceous cyst 10/27/2020   OSA on CPAP 06/16/2020   CPAP use counseling 06/16/2020   Prediabetes 02/29/2020   Chronic left shoulder pain 02/28/2020   BMI 50.0-59.9, adult (HCC) 10/04/2019   Mixed hyperlipidemia 08/01/2018    Vitamin D  deficiency 07/30/2018   Benign hypertension 06/08/2017   Morbid obesity (HCC) 03/10/2015    1. OSA on CPAP (Primary) The patient does tolerate PAP and reports  benefit from PAP use. The patient was reminded how to clean equipment and advised to replace supplies routinely. The patient was also counselled on weight loss. The compliance is excellent. The AHI is 0.3.   OSA on cpap- controlled. Continue with excellent compliance with pap. CPAP continues to be medically necessary to treat this patient's OSA. F/u one year.    2. CPAP use counseling CPAP Counseling: had a lengthy discussion with the patient regarding the importance of PAP therapy in management of the sleep apnea. Patient appears to understand the risk factor reduction and also understands the risks associated with untreated sleep apnea. Patient will try to make a good faith effort to remain compliant with therapy. Also instructed the patient on proper cleaning of the device including the water must be changed daily if possible and use of distilled water is preferred. Patient understands that the machine should be regularly cleaned with appropriate recommended cleaning solutions that do not damage the PAP machine for example given white vinegar and water rinses. Other methods such as ozone treatment may not be as good as these simple methods to achieve cleaning.   3. Morbid obesity (HCC) Obesity Counseling: Had a lengthy discussion regarding patients BMI and weight issues. Patient was instructed on portion control as well as increased activity. Also discussed caloric restrictions with trying to maintain intake less than 2000 Kcal. Discussions were made in accordance with the 5As of weight management. Simple actions such as not eating late and if able to, taking a walk is suggested.     General Counseling: I have discussed the findings of the evaluation and examination with Andrea.  I have also discussed any further diagnostic  evaluation thatmay be needed or ordered today. Donyale verbalizes understanding of the findings of todays visit. We also reviewed his medications today and discussed drug interactions and side effects including but not limited excessive drowsiness and altered mental states. We  also discussed that there is always a risk not just to him but also people around him. he has been encouraged to call the office with any questions or concerns that should arise related to todays visit.  No orders of the defined types were placed in this encounter.       I have personally obtained a history, examined the patient, evaluated laboratory and imaging results, formulated the assessment and plan and placed orders. This patient was seen today by Lauraine Lay, PA-C in collaboration with Dr. Elfreda Bathe.   Elfreda DELENA Bathe, MD [facility] Diplomate ABMS Pulmonary Critical Care Medicine and Sleep Medicine

## 2024-01-16 ENCOUNTER — Ambulatory Visit (INDEPENDENT_AMBULATORY_CARE_PROVIDER_SITE_OTHER): Admitting: Internal Medicine

## 2024-01-16 VITALS — BP 143/87 | HR 68 | Resp 16 | Ht 67.0 in | Wt 350.0 lb

## 2024-01-16 DIAGNOSIS — G4733 Obstructive sleep apnea (adult) (pediatric): Secondary | ICD-10-CM

## 2024-01-16 DIAGNOSIS — Z7189 Other specified counseling: Secondary | ICD-10-CM

## 2024-01-16 NOTE — Patient Instructions (Signed)

## 2024-02-10 ENCOUNTER — Other Ambulatory Visit: Payer: Self-pay | Admitting: Nurse Practitioner

## 2024-02-10 NOTE — Telephone Encounter (Signed)
 Requested medications are due for refill today.  yes  Requested medications are on the active medications list.  yes  Last refill. 05/09/2023 #90 2 rf  Future visit scheduled.   A wellness visit  Notes to clinic.  Labs are expired. Pt last seen 1 year ago    Requested Prescriptions  Pending Prescriptions Disp Refills   atorvastatin  (LIPITOR) 10 MG tablet [Pharmacy Med Name: ATORVASTATIN  10 MG TABLET] 90 tablet 2    Sig: TAKE 1 TABLET BY MOUTH EVERY DAY     Cardiovascular:  Antilipid - Statins Failed - 02/10/2024 11:42 AM      Failed - Valid encounter within last 12 months    Recent Outpatient Visits   None            Failed - Lipid Panel in normal range within the last 12 months    Cholesterol, Total  Date Value Ref Range Status  02/09/2023 136 100 - 199 mg/dL Final   LDL Chol Calc (NIH)  Date Value Ref Range Status  02/09/2023 71 0 - 99 mg/dL Final   HDL  Date Value Ref Range Status  02/09/2023 50 >39 mg/dL Final   Triglycerides  Date Value Ref Range Status  02/09/2023 76 0 - 149 mg/dL Final         Passed - Patient is not pregnant

## 2024-02-13 NOTE — Telephone Encounter (Signed)
 Appt scheduled

## 2024-02-15 DIAGNOSIS — H524 Presbyopia: Secondary | ICD-10-CM | POA: Diagnosis not present

## 2024-03-06 NOTE — Progress Notes (Signed)
 NATHIAN STENCIL                                          MRN: [id number]   03/06/2024   The [facility]I Quality Team Specialist reviewed this patient medical record for the purposes of chart review for care gap closure. The following were reviewed: chart review for care gap closure-controlling blood pressure.    VBCI Quality Team

## 2024-04-01 NOTE — Patient Instructions (Signed)
 Be Involved in Caring For Your Health:  Taking Medications When medications are taken as directed, they can greatly improve your health. But if they are not taken as prescribed, they may not work. In some cases, not taking them correctly can be harmful. To help ensure your treatment remains effective and safe, understand your medications and how to take them. Bring your medications to each visit for review by your provider.  Your lab results, notes, and after visit summary will be available on My Chart. We strongly encourage you to use this feature. If lab results are abnormal the clinic will contact you with the appropriate steps. If the clinic does not contact you assume the results are satisfactory. You can always view your results on My Chart. If you have questions regarding your health or results, please contact the clinic during office hours. You can also ask questions on My Chart.  We at [facility] are grateful that you chose [place] to provide your care. We strive to provide evidence-based and compassionate care and are always looking for feedback. If you get a survey from the clinic please complete this so we can hear your opinions.  Prediabetes: What to Know Prediabetes is when your blood sugar, also called glucose, is at a higher level than normal but not high enough for you to be diagnosed with type 2 diabetes (type 2 diabetes mellitus). Having prediabetes puts you at risk for getting type 2 diabetes. By making some healthy changes, you may be able to prevent or delay getting type 2 diabetes. This is important because type 2 diabetes can lead to serious problems. Some of these include: Heart disease. Stroke. Blindness. Kidney disease. Depression. Poor blood flow in the feet and legs. In very bad cases, this could lead to having a leg removed by surgery (amputation). What are the causes? The exact cause of prediabetes isn't known. It may result from insulin resistance. Insulin  resistance happens when cells in the body don't respond properly to insulin that the body makes. This can cause too much sugar to build up in the blood. High blood sugar, also called hyperglycemia, can develop. What increases the risk? Having a family member with type 2 diabetes. Being older than 71 years of age. Having had a temporary form of diabetes during a pregnancy. This is called gestational diabetes. Having had polycystic ovary syndrome (PCOS). Being overweight or obese. Being inactive and not getting much exercise. Having a history of heart disease. This may include problems with cholesterol levels, high levels of blood fats, or high blood pressure. What are the signs or symptoms? You may have no symptoms. If you do have symptoms, they may include: Increased hunger. Increased thirst. Needing to pee more often. Changes in how you see, like blurry vision. Feeling tired. How is this diagnosed? Prediabetes can be diagnosed with blood tests that check your blood sugar. One or more of these tests may be done: A fasting blood glucose (FBG) test. You won't be allowed to eat (you will fast) for at least 8 hours before a blood sample is taken. An A1C blood test, also called a hemoglobin A1C test. This test shows information about blood sugar levels over the past 2?3 months. An oral glucose tolerance test (OGTT). This test measures your blood sugar at two points in time: After you haven't eaten for a while. This is your baseline level. Two hours after you drink a beverage that has sugar in it. You may be diagnosed with prediabetes  if: Your FBG is 100?125 mg/dL (1.6-1.0 mmol/L). Your A1C level is 5.7?6.4% (39-46 mmol/mol). Your OGTT result is 140?199 mg/dL (9.6-04 mmol/L). These blood tests may need to be done again to be sure of the diagnosis. How is this treated? Treatment may include making changes to your diet and lifestyle. These changes can help lower your blood sugar and keep you  from getting type 2 diabetes. In some cases, medicine may be given to help lower your risk. Follow these instructions at home: Eating and drinking  Eat and drink as told. Follow a healthy meal plan. This includes eating lean proteins, whole grains, legumes, fresh fruits and vegetables, low-fat dairy products, and healthy fats. Meet with an expert in healthy eating called a dietitian. This person can help create a healthy eating plan that's right for you. Lifestyle Do moderate-intensity exercise. Do this for at least 30 minutes a day on 5 or more days each week, or as told by your health care provider. A mix of activities may be best. Good choices include brisk walking, swimming, biking, and weight lifting. Try to lose weight if your provider says it's OK. Losing 5-7% of your body weight can help reverse insulin resistance. Do not drink alcohol if: Your provider tells you not to drink. You're pregnant, may be pregnant, or plan to become pregnant. If you drink alcohol: Limit how much you have to: 0-1 drink a day if you're male. 0-2 drinks a day if you're male. Know how much alcohol is in your drink. In the [place] one drink is one 12 oz bottle of beer (355 mL), one 5 oz glass of wine (148 mL), or one 1 oz glass of hard liquor (44 mL). General instructions Take medicines only as told. You may be given medicines that help lower the risk of type 2 diabetes. Do not smoke, vape, or use nicotine or tobacco. Where to find more information American Diabetes Association: diabetes.org/about-diabetes/prediabetes Academy of Nutrition and Dietetics: eatright.org American Heart Association: Go to ThisJobs.cz. Click the search icon. Type "prediabetes" in the search box. Contact a health care provider if: You have any of these symptoms: Increased hunger. Peeing more often than usual. Increased thirst. Feeling tired. Changes in how you see, like blurry vision. Feeling like you may throw  up. Throwing up. Get help right away if: You have shortness of breath. You feel confused. This information is not intended to replace advice given to you by your health care provider. Make sure you discuss any questions you have with your health care provider. Document Revised: 12/26/2022 Document Reviewed: 12/26/2022 Elsevier Patient Education  2024 [employer].

## 2024-04-02 ENCOUNTER — Encounter: Payer: Self-pay | Admitting: Nurse Practitioner

## 2024-04-02 ENCOUNTER — Ambulatory Visit: Payer: Self-pay | Admitting: Nurse Practitioner

## 2024-04-02 DIAGNOSIS — Z1211 Encounter for screening for malignant neoplasm of colon: Secondary | ICD-10-CM

## 2024-04-02 DIAGNOSIS — I10 Essential (primary) hypertension: Secondary | ICD-10-CM

## 2024-04-02 DIAGNOSIS — Z0000 Encounter for general adult medical examination without abnormal findings: Secondary | ICD-10-CM

## 2024-04-02 DIAGNOSIS — N400 Enlarged prostate without lower urinary tract symptoms: Secondary | ICD-10-CM

## 2024-04-02 DIAGNOSIS — E782 Mixed hyperlipidemia: Secondary | ICD-10-CM | POA: Diagnosis not present

## 2024-04-02 DIAGNOSIS — R7303 Prediabetes: Secondary | ICD-10-CM

## 2024-04-02 DIAGNOSIS — R21 Rash and other nonspecific skin eruption: Secondary | ICD-10-CM | POA: Diagnosis not present

## 2024-04-02 DIAGNOSIS — E559 Vitamin D deficiency, unspecified: Secondary | ICD-10-CM | POA: Diagnosis not present

## 2024-04-02 DIAGNOSIS — Z6841 Body Mass Index (BMI) 40.0 and over, adult: Secondary | ICD-10-CM | POA: Diagnosis not present

## 2024-04-02 DIAGNOSIS — G4733 Obstructive sleep apnea (adult) (pediatric): Secondary | ICD-10-CM

## 2024-04-02 LAB — MICROALBUMIN, URINE WAIVED
Creatinine, Urine Waived: 50 mg/dL (ref 10–300)
Microalb, Ur Waived: 10 mg/L (ref 0–19)
Microalb/Creat Ratio: <30 mg/g (ref <30)

## 2024-04-02 LAB — BAYER DCA HB A1C WAIVED: HB A1C (BAYER DCA - WAIVED): 5.6 % (ref 4.8–5.6)

## 2024-04-02 MED ORDER — TRIAMCINOLONE ACETONIDE 0.1 % EX CREA: 1.0000 | TOPICAL_CREAM | Freq: Two times a day (BID) | CUTANEOUS | 3 refills | Status: AC

## 2024-04-02 MED ORDER — ATORVASTATIN CALCIUM 10 MG PO TABS: 10.0000 mg | ORAL_TABLET | Freq: Every day | ORAL | 4 refills | Status: AC

## 2024-04-02 NOTE — Assessment & Plan Note (Signed)
 Chronic, ongoing.  Continue 100% use of CPAP and collaboration with sleep apnea provider.

## 2024-04-02 NOTE — Progress Notes (Signed)
 BP 124/74 (BP Location: Left Arm, Patient Position: Sitting, Cuff Size: Large)   Pulse 66   Temp 98.9 F (37.2 C) (Oral)   Resp 15   Ht 5' 6.89 (1.699 m)   Wt (!) 341 lb 12.8 oz (155 kg)   SpO2 97%   BMI 53.71 kg/m    Subjective:    Patient ID: Robert Rasmussen, male    DOB: September 17, 1950, 73 y.o.   MRN: [id number]  HPI: Robert Rasmussen is a 73 y.o. male presenting on 04/02/2024 for comprehensive medical examination. Current medical complaints include:none  He currently lives with: self Interim Problems from his last visit: no  Has rash he would like looked at today.  HYPERLIPIDEMIA Taking Atorvastatin  daily. Uses CPAP at home 100% of the time, saw sleep provider 01/16/24. BP checks at home have fluctuate, on average 120/70 to 130/80 range.  Takes Vitamin D  supplement daily. Hyperlipidemia status: good compliance Satisfied with current treatment?  yes Side effects:  no Medication compliance: good compliance Past cholesterol meds: no Supplements: none Aspirin:  no The 10-year ASCVD risk score (Arnett DK, et al., 2019) is: 15.4%   Values used to calculate the score:     Age: 23 years     Clincally relevant sex: Male     Is Non-Hispanic African American: No     Diabetic: No     Tobacco smoker: No     Systolic Blood Pressure: 124 mmHg     Is BP treated: No     HDL Cholesterol: 50 mg/dL     Total Cholesterol: 136 mg/dL Chest pain:  no Coronary artery disease:  no Family history CAD:  yes -- paternal grandmother, father had multiple bypass surgeries Family history early CAD:  no   Impaired Fasting Glucose HbA1C:  Lab Results  Component Value Date   HGBA1C 5.8 (H) 02/09/2023  Duration of elevated blood sugar: chronic Polydipsia: no Polyuria: no Weight change: no Visual disturbance: no Glucose Monitoring: no    Accucheck frequency: Not Checking    Fasting glucose:     Post prandial:  Diabetic Education: Not Completed Family history of diabetes: no   Functional Status  Survey: Is the patient deaf or have difficulty hearing?: No Does the patient have difficulty seeing, even when wearing glasses/contacts?: No Does the patient have difficulty concentrating, remembering, or making decisions?: No Does the patient have difficulty walking or climbing stairs?: No Does the patient have difficulty dressing or bathing?: No Does the patient have difficulty doing errands alone such as visiting a doctor's office or shopping?: No     02/09/2023    1:13 PM 03/25/2023    5:12 PM 03/29/2023    3:36 PM 03/31/2024    5:45 PM 04/02/2024    1:35 PM  Fall Risk  Falls in the past year? 0 0 0 1 0  Was there an injury with Fall? 0  0 0 0  Fall Risk Category Calculator 0  0 1  0  Patient at Risk for Falls Due to No Fall Risks  No Fall Risks  No Fall Risks  Fall risk Follow up Falls evaluation completed  Falls prevention discussed  Falls evaluation completed     Patient-reported        04/02/2024    1:36 PM 03/29/2023    3:40 PM 02/09/2023    1:13 PM 08/09/2022    1:38 PM 01/12/2022   10:28 AM  Depression screen PHQ 2/9  Decreased Interest 0 0 0  0 0  Down, Depressed, Hopeless 0 0 0 0 0  PHQ - 2 Score 0 0 0 0 0  Altered sleeping 0 0 0 0 0  Tired, decreased energy 0 0 0 1 1  Change in appetite 0 0 0 0 1  Feeling bad or failure about yourself  0 0 0 0 0  Trouble concentrating 0 0 0 0 0  Moving slowly or fidgety/restless 0 0 0 0 0  Suicidal thoughts 0 0 0 0 0  PHQ-9 Score 0 0 0 1 2  Difficult doing work/chores  Not difficult at all Not difficult at all Not difficult at all Not difficult at all       04/02/2024    1:36 PM 02/09/2023    1:13 PM 08/09/2022    1:39 PM 01/12/2022   10:28 AM  GAD 7 : Generalized Anxiety Score  Nervous, Anxious, on Edge 0 0 0 0  Control/stop worrying 0 0 0 0  Worry too much - different things 0 0 0 0  Trouble relaxing 0 0 0 0  Restless 0 0 0 0  Easily annoyed or irritable 0 0 0 0  Afraid - awful might happen 0 0 0 0  Total GAD 7 Score 0 0 0  0  Anxiety Difficulty  Not difficult at all Not difficult at all         02/09/2023    1:13 PM 03/25/2023    5:12 PM 03/29/2023    3:36 PM 03/31/2024    5:45 PM 04/02/2024    1:35 PM  Fall Risk  Falls in the past year? 0 0 0 1 0  Was there an injury with Fall? 0  0 0 0  Fall Risk Category Calculator 0  0 1  0  Patient at Risk for Falls Due to No Fall Risks  No Fall Risks  No Fall Risks  Fall risk Follow up Falls evaluation completed  Falls prevention discussed  Falls evaluation completed     Patient-reported    Past Medical History:  Past Medical History:  Diagnosis Date   Allergy    seasonal   Amebic dysentery    Asthma    Cataract 02/06/2023   Cellulitis    GERD (gastroesophageal reflux disease)    Hyperlipidemia    Sleep apnea 2016   using a cpap    Surgical History:  Past Surgical History:  Procedure Laterality Date   TONSILLECTOMY      Medications:  Current Outpatient Medications on File Prior to Visit  Medication Sig   Cholecalciferol (VITAMIN D3) 25 MCG (1000 UT) CAPS Take 1 capsule by mouth daily.   Multiple Vitamins-Minerals (ONE-A-DAY MENS 50+ ADVANTAGE) TABS Take 1 tablet by mouth daily.   No current facility-administered medications on file prior to visit.    Allergies:  No Known Allergies  Social History:  Social History   Socioeconomic History   Marital status: Divorced    Spouse name: Not on file   Number of children: Not on file   Years of education: Not on file   Highest education level: Master's degree (e.g., MA, MS, MEng, MEd, MSW, MBA)  Occupational History   Occupation: retired  Tobacco Use   Smoking status: Never   Smokeless tobacco: Never  Vaping Use   Vaping status: Never Used  Substance and Sexual Activity   Alcohol use: Not Currently    Alcohol/week: 1.0 standard drink of alcohol    Types: 1 Standard drinks or equivalent  per week    Comment: One drink every three months   Drug use: No   Sexual activity: Not Currently     Birth control/protection: Condom  Other Topics Concern   Not on file  Social History Narrative   Not on file   Social Drivers of Health   Financial Resource Strain: Low Risk  (03/29/2024)   Overall Financial Resource Strain (CARDIA)    Difficulty of Paying Living Expenses: Not hard at all  Food Insecurity: No Food Insecurity (03/29/2024)   Hunger Vital Sign    Worried About Running Out of Food in the Last Year: Never true    Ran Out of Food in the Last Year: Never true  Transportation Needs: No Transportation Needs (03/29/2024)   PRAPARE - Administrator, Civil Service (Medical): No    Lack of Transportation (Non-Medical): No  Physical Activity: Sufficiently Active (03/29/2024)   Exercise Vital Sign    Days of Exercise per Week: 4 days    Minutes of Exercise per Session: 60 min  Stress: No Stress Concern Present (03/29/2024)   [employer] of Occupational Health - Occupational Stress Questionnaire    Feeling of Stress: Not at all  Social Connections: Moderately Integrated (03/29/2024)   Social Connection and Isolation Panel    Frequency of Communication with Friends and Family: More than three times a week    Frequency of Social Gatherings with Friends and Family: Once a week    Attends Religious Services: 1 to 4 times per year    Active Member of [employer] or Organizations: Yes    Attends Engineer, Structural: More than 4 times per year    Marital Status: Divorced  Intimate Partner Violence: Not At Risk (03/29/2023)   Humiliation, Afraid, Rape, and Kick questionnaire    Fear of Current or Ex-Partner: No    Emotionally Abused: No    Physically Abused: No    Sexually Abused: No   Social History   Tobacco Use  Smoking Status Never  Smokeless Tobacco Never   Social History   Substance and Sexual Activity  Alcohol Use Not Currently   Alcohol/week: 1.0 standard drink of alcohol   Types: 1 Standard drinks or equivalent per week   Comment: One drink  every three months    Family History:  Family History  Problem Relation Age of Onset   Cancer Mother        breast   Breast cancer Mother    Hearing loss Mother    Varicose Veins Mother    Cancer Father        skin   Skin cancer Father    Heart disease Father    Cancer Sister        breast   Breast cancer Sister    Heart disease Paternal Grandmother        heart attack   Prostate cancer Neg Hx    Colon cancer Neg Hx    Past medical history, surgical history, medications, allergies, family history and social history reviewed with patient today and changes made to appropriate areas of the chart.   ROS All other ROS negative except what is listed above and in the HPI.      Objective:    BP 124/74 (BP Location: Left Arm, Patient Position: Sitting, Cuff Size: Large)   Pulse 66   Temp 98.9 F (37.2 C) (Oral)   Resp 15   Ht 5' 6.89 (1.699 m)   Wt (!) 341  lb 12.8 oz (155 kg)   SpO2 97%   BMI 53.71 kg/m   Wt Readings from Last 3 Encounters:  04/02/24 (!) 341 lb 12.8 oz (155 kg)  01/16/24 (!) 350 lb (158.8 kg)  03/29/23 (!) 338 lb (153.3 kg)    Physical Exam Vitals and nursing note reviewed.  Constitutional:      General: He is awake. He is not in acute distress.    Appearance: He is well-developed and well-groomed. He is morbidly obese. He is not ill-appearing or toxic-appearing.  HENT:     Head: Normocephalic and atraumatic.     Right Ear: Hearing, tympanic membrane, ear canal and external ear normal. No drainage.     Left Ear: Hearing, tympanic membrane, ear canal and external ear normal. No drainage.     Nose: Nose normal.     Mouth/Throat:     Pharynx: Uvula midline.  Eyes:     General: Lids are normal.        Right eye: No discharge.        Left eye: No discharge.     Extraocular Movements: Extraocular movements intact.     Conjunctiva/sclera: Conjunctivae normal.     Pupils: Pupils are equal, round, and reactive to light.     Visual Fields: Right eye  visual fields normal and left eye visual fields normal.  Neck:     Thyroid : No thyromegaly.     Vascular: No carotid bruit or JVD.     Trachea: Trachea normal.  Cardiovascular:     Rate and Rhythm: Normal rate and regular rhythm.     Heart sounds: Normal heart sounds, S1 normal and S2 normal. No murmur heard.    No gallop.  Pulmonary:     Effort: Pulmonary effort is normal. No accessory muscle usage or respiratory distress.     Breath sounds: Normal breath sounds.  Abdominal:     General: Bowel sounds are normal.     Palpations: Abdomen is soft. There is no hepatomegaly or splenomegaly.     Tenderness: There is no abdominal tenderness.  Musculoskeletal:        General: Normal range of motion.     Cervical back: Normal range of motion and neck supple.     Right lower leg: No edema.     Left lower leg: No edema.  Lymphadenopathy:     Head:     Right side of head: No submental, submandibular, tonsillar, preauricular or posterior auricular adenopathy.     Left side of head: No submental, submandibular, tonsillar, preauricular or posterior auricular adenopathy.     Cervical: No cervical adenopathy.  Skin:    General: Skin is warm and dry.     Capillary Refill: Capillary refill takes less than 2 seconds.     Findings: Rash present. Rash is scaling.     Comments: Scaly patches of rash to lower back and upper right arm, erythema at base.  Neurological:     Mental Status: He is alert and oriented to person, place, and time.     Gait: Gait is intact.     Deep Tendon Reflexes: Reflexes are normal and symmetric.     Reflex Scores:      Brachioradialis reflexes are 2+ on the right side and 2+ on the left side.      Patellar reflexes are 2+ on the right side and 2+ on the left side. Psychiatric:        Attention and Perception: Attention normal.  Mood and Affect: Mood normal.        Speech: Speech normal.        Behavior: Behavior normal. Behavior is cooperative.        Thought  Content: Thought content normal.        Cognition and Memory: Cognition normal.       03/29/2023    3:36 PM 09/08/2021   10:32 AM 08/25/2020    1:47 PM  6CIT Screen  What Year? 0 points  0 points  What month? 0 points  0 points  What time? 0 points 0 points 0 points  Count back from 20 0 points 0 points 0 points  Months in reverse 0 points 0 points 0 points  Repeat phrase 0 points 0 points 0 points  Total Score 0 points  0 points   Results for orders placed or performed in visit on 02/09/23  Bayer DCA Hb A1c Waived   Collection Time: 02/09/23  1:23 PM  Result Value Ref Range   HB A1C (BAYER DCA - WAIVED) 5.8 (H) 4.8 - 5.6 %  Comprehensive metabolic panel   Collection Time: 02/09/23  1:24 PM  Result Value Ref Range   Glucose 107 (H) 70 - 99 mg/dL   BUN 15 8 - 27 mg/dL   Creatinine, Ser 9.16 0.76 - 1.27 mg/dL   eGFR 94 >40 fO/fpw/8.26   BUN/Creatinine Ratio 18 10 - 24   Sodium 141 134 - 144 mmol/L   Potassium 4.5 3.5 - 5.2 mmol/L   Chloride 107 (H) 96 - 106 mmol/L   CO2 21 20 - 29 mmol/L   Calcium  9.1 8.6 - 10.2 mg/dL   Total Protein 6.2 6.0 - 8.5 g/dL   Albumin 4.1 3.9 - 4.9 g/dL   Globulin, Total 2.1 1.5 - 4.5 g/dL   Bilirubin Total 0.8 0.0 - 1.2 mg/dL   Alkaline Phosphatase 50 44 - 121 IU/L   AST 23 0 - 40 IU/L   ALT 26 0 - 44 IU/L  Lipid Panel w/o Chol/HDL Ratio   Collection Time: 02/09/23  1:24 PM  Result Value Ref Range   Cholesterol, Total 136 100 - 199 mg/dL   Triglycerides 76 0 - 149 mg/dL   HDL 50 >60 mg/dL   VLDL Cholesterol Cal 15 5 - 40 mg/dL   LDL Chol Calc (NIH) 71 0 - 99 mg/dL      Assessment & Plan:   Problem List Items Addressed This Visit       Cardiovascular and Mediastinum   Benign hypertension   Chronic, stable.  BP at goal in office today on recheck without medication  Hiking often. Recommend he monitor BP at least a few mornings a week at home and document.  DASH diet at home. Labs today: CBC, TSH, CMP, urine ALB.         Relevant  Medications   atorvastatin  (LIPITOR) 10 MG tablet   Other Relevant Orders   Microalbumin, Urine Waived   CBC with Differential/Platelet   Comprehensive metabolic panel with GFR   TSH     Respiratory   OSA on CPAP   Chronic, ongoing.  Continue 100% use of CPAP and collaboration with sleep apnea provider.        Musculoskeletal and Integument   Rash   Acute, scaly patches to lower back and right upper arm.  Send in Triamcinolone to trial. If this offers no benefit he is to return to office for reassessment.  Other   Vitamin D  deficiency   Chronic, ongoing, continue supplement daily and recheck level today.      Relevant Orders   VITAMIN D  25 Hydroxy (Vit-D Deficiency, Fractures)   Prediabetes   Ongoing.  Last check 5.8%, will recheck today.  Continue diet focus at home and exercise, unable to get coverage for GLP1.  Educated at length on these medications.      Relevant Orders   Bayer DCA Hb A1c Waived   Microalbumin, Urine Waived   Morbid obesity (HCC) - Primary   BMI 53.71.  Educated at length on these medications.  Recommended eating smaller high protein, low fat meals more frequently and exercising 30 mins a day 5 times a week with a goal of 10-15lb weight loss in the next 3 months. Patient voiced their understanding and motivation to adhere to these recommendations.       Mixed hyperlipidemia   Chronic, ongoing.  Continue focus on diet changes and continue Atorvastatin  10 MG daily, recheck lipid panel today and adjust medication as needed.  Refills sent.      Relevant Medications   atorvastatin  (LIPITOR) 10 MG tablet   Other Relevant Orders   Comprehensive metabolic panel with GFR   Lipid Panel w/o Chol/HDL Ratio   BMI 50.0-59.9, adult (HCC)   Refer to morbid obesity plan of care.       Other Visit Diagnoses       Benign prostatic hyperplasia without lower urinary tract symptoms       PSA on labs today.   Relevant Orders   PSA     Colon cancer  screening       Cologuard ordered.   Relevant Orders   Cologuard     Encounter for annual physical exam       Annual physical today with labs and health maintenance reviewed, discussed with patient.       Discussed aspirin prophylaxis for myocardial infarction prevention and decision was it was not indicated  LABORATORY TESTING:  Health maintenance labs ordered today as discussed above.   The natural history of prostate cancer and ongoing controversy regarding screening and potential treatment outcomes of prostate cancer has been discussed with the patient. The meaning of a false positive PSA and a false negative PSA has been discussed. He indicates understanding of the limitations of this screening test and wishes to proceed with screening PSA testing.   IMMUNIZATIONS:   - Tdap: Tetanus vaccination status reviewed: last tetanus booster within 10 years. - Influenza:Up To Date - Pneumovax: Up to date - Prevnar: Up to date - Zostavax vaccine: Up To Date  SCREENING: - Colonoscopy: Not Up to date -- Cologuard Discussed with patient purpose of the colonoscopy is to detect colon cancer at curable precancerous or early stages   - AAA Screening: Not applicable  -Hearing Test: Not applicable  -Spirometry: Not applicable   PATIENT COUNSELING:    Sexuality: Discussed sexually transmitted diseases, partner selection, use of condoms, avoidance of unintended pregnancy  and contraceptive alternatives.   Advised to avoid cigarette smoking.  I discussed with the patient that most people either abstain from alcohol or drink within safe limits (<=14/week and <=4 drinks/occasion for males, <=7/weeks and <= 3 drinks/occasion for females) and that the risk for alcohol disorders and other health effects rises proportionally with the number of drinks per week and how often a drinker exceeds daily limits.  Discussed cessation/primary prevention of drug use and availability of treatment for abuse.  Diet: Encouraged to adjust caloric intake to maintain  or achieve ideal body weight, to reduce intake of dietary saturated fat and total fat, to limit sodium intake by avoiding high sodium foods and not adding table salt, and to maintain adequate dietary potassium and calcium  preferably from fresh fruits, vegetables, and low-fat dairy products.    Stressed the importance of regular exercise  Injury prevention: Discussed safety belts, safety helmets, smoke detector, smoking near bedding or upholstery.   Dental health: Discussed importance of regular tooth brushing, flossing, and dental visits.   Follow up plan: NEXT PREVENTATIVE PHYSICAL DUE IN 1 YEAR. Return in about 6 months (around 10/01/2024) for HTN/HLD, IFG.

## 2024-04-02 NOTE — Assessment & Plan Note (Signed)
 Ongoing.  Last check 5.8%, will recheck today.  Continue diet focus at home and exercise, unable to get coverage for GLP1.  Educated at length on these medications.

## 2024-04-02 NOTE — Assessment & Plan Note (Signed)
 Chronic, stable.  BP at goal in office today on recheck without medication  Hiking often. Recommend he monitor BP at least a few mornings a week at home and document.  DASH diet at home. Labs today: CBC, TSH, CMP, urine ALB.

## 2024-04-02 NOTE — Assessment & Plan Note (Signed)
 Chronic, ongoing.  Continue focus on diet changes and continue Atorvastatin  10 MG daily, recheck lipid panel today and adjust medication as needed.  Refills sent.

## 2024-04-02 NOTE — Assessment & Plan Note (Signed)
 Refer to morbid obesity plan of care.

## 2024-04-02 NOTE — Assessment & Plan Note (Signed)
 Acute, scaly patches to lower back and right upper arm.  Send in Triamcinolone to trial. If this offers no benefit he is to return to office for reassessment.

## 2024-04-02 NOTE — Assessment & Plan Note (Signed)
Chronic, ongoing, continue supplement daily and recheck level today. 

## 2024-04-02 NOTE — Assessment & Plan Note (Signed)
 BMI 53.71.  Educated at length on these medications.  Recommended eating smaller high protein, low fat meals more frequently and exercising 30 mins a day 5 times a week with a goal of 10-15lb weight loss in the next 3 months. Patient voiced their understanding and motivation to adhere to these recommendations.

## 2024-04-03 ENCOUNTER — Ambulatory Visit: Payer: Self-pay | Admitting: Nurse Practitioner

## 2024-04-03 ENCOUNTER — Ambulatory Visit (INDEPENDENT_AMBULATORY_CARE_PROVIDER_SITE_OTHER): Payer: Self-pay | Admitting: Emergency Medicine

## 2024-04-03 VITALS — BP 134/68 | Ht 66.75 in | Wt 341.8 lb

## 2024-04-03 DIAGNOSIS — Z0000 Encounter for general adult medical examination without abnormal findings: Secondary | ICD-10-CM

## 2024-04-03 LAB — CBC WITH DIFFERENTIAL/PLATELET
Basophils Absolute: 0.1 x10E3/uL (ref 0.0–0.2)
Basos: 1 %
EOS (ABSOLUTE): 0.2 x10E3/uL (ref 0.0–0.4)
Eos: 3 %
Hematocrit: 44.9 % (ref 37.5–51.0)
Hemoglobin: 14.7 g/dL (ref 13.0–17.7)
Immature Grans (Abs): 0 x10E3/uL (ref 0.0–0.1)
Immature Granulocytes: 0 %
Lymphocytes Absolute: 2.1 x10E3/uL (ref 0.7–3.1)
Lymphs: 25 %
MCH: 32.6 pg (ref 26.6–33.0)
MCHC: 32.7 g/dL (ref 31.5–35.7)
MCV: 100 fL — ABNORMAL HIGH (ref 79–97)
Monocytes Absolute: 0.8 x10E3/uL (ref 0.1–0.9)
Monocytes: 9 %
Neutrophils Absolute: 5.2 x10E3/uL (ref 1.4–7.0)
Neutrophils: 62 %
Platelets: 204 x10E3/uL (ref 150–450)
RBC: 4.51 x10E6/uL (ref 4.14–5.80)
RDW: 11.8 % (ref 11.6–15.4)
WBC: 8.4 x10E3/uL (ref 3.4–10.8)

## 2024-04-03 LAB — LIPID PANEL W/O CHOL/HDL RATIO
Cholesterol, Total: 139 mg/dL (ref 100–199)
HDL: 47 mg/dL (ref >39)
LDL Chol Calc (NIH): 74 mg/dL (ref 0–99)
Triglycerides: 99 mg/dL (ref 0–149)
VLDL Cholesterol Cal: 18 mg/dL (ref 5–40)

## 2024-04-03 LAB — COMPREHENSIVE METABOLIC PANEL WITH GFR
ALT: 30 IU/L (ref 0–44)
AST: 25 IU/L (ref 0–40)
Albumin: 4.1 g/dL (ref 3.8–4.8)
Alkaline Phosphatase: 51 IU/L (ref 47–123)
BUN/Creatinine Ratio: 17 (ref 10–24)
BUN: 17 mg/dL (ref 8–27)
Bilirubin Total: 0.5 mg/dL (ref 0.0–1.2)
CO2: 23 mmol/L (ref 20–29)
Calcium: 9.3 mg/dL (ref 8.6–10.2)
Chloride: 103 mmol/L (ref 96–106)
Creatinine, Ser: 1.01 mg/dL (ref 0.76–1.27)
Globulin, Total: 2.3 g/dL (ref 1.5–4.5)
Glucose: 99 mg/dL (ref 70–99)
Potassium: 4.2 mmol/L (ref 3.5–5.2)
Sodium: 139 mmol/L (ref 134–144)
Total Protein: 6.4 g/dL (ref 6.0–8.5)
eGFR: 80 mL/min/1.73 (ref >59)

## 2024-04-03 LAB — VITAMIN D 25 HYDROXY (VIT D DEFICIENCY, FRACTURES): Vit D, 25-Hydroxy: 32.2 ng/mL (ref 30.0–100.0)

## 2024-04-03 LAB — PSA: Prostate Specific Ag, Serum: 0.3 ng/mL (ref 0.0–4.0)

## 2024-04-03 LAB — TSH: TSH: 1.84 u[IU]/mL (ref 0.450–4.500)

## 2024-04-03 NOTE — Patient Instructions (Signed)
 [name],  Thank you for taking the time for your Medicare Wellness Visit. I appreciate your continued commitment to your health goals. Please review the care plan we discussed, and feel free to reach out if I can assist you further.  Medicare recommends these wellness visits once per year to help you and your care team stay ahead of potential health issues. These visits are designed to focus on prevention, allowing your provider to concentrate on managing your acute and chronic conditions during your regular appointments.  Please note that Annual Wellness Visits do not include a physical exam. Some assessments may be limited, especially if the visit was conducted virtually. If needed, we may recommend a separate in-person follow-up with your provider.  Ongoing Care Seeing your primary care provider every 3 to 6 months helps us  monitor your health and provide consistent, personalized care.   Referrals If a referral was made during today's visit and you haven't received any updates within two weeks, please contact the referred provider directly to check on the status.  Recommended Screenings: Complete the cologuard kit once you receive it. Keep up the good work!  Health Maintenance  Topic Date Due   Cologuard (Stool DNA test)  07/22/2023   COVID-19 Vaccine (10 - 2025-26 season) 04/17/2024*   Medicare Annual Wellness Visit  04/03/2025   DTaP/Tdap/Td vaccine (3 - Td or Tdap) 07/14/2026   Pneumococcal Vaccine for age over 14  Completed   Flu Shot  Completed   Hepatitis C Screening  Completed   Zoster (Shingles) Vaccine  Completed   Meningitis B Vaccine  Aged Out  *Topic was postponed. The date shown is not the original due date.       04/03/2024    1:21 PM  Advanced Directives  Does Patient Have a Medical Advance Directive? Yes  Type of Estate Agent of [place];Living will  Does patient want to make changes to medical advance directive? No - Patient declined   Copy of Healthcare Power of Attorney in Chart? No - copy requested   Advance Care Planning is important because it: Ensures you receive medical care that aligns with your values, goals, and preferences. Provides guidance to your family and loved ones, reducing the emotional burden of decision-making during critical moments.  Vision: Annual vision screenings are recommended for early detection of glaucoma, cataracts, and diabetic retinopathy. These exams can also reveal signs of chronic conditions such as diabetes and high blood pressure.  Dental: Annual dental screenings help detect early signs of oral cancer, gum disease, and other conditions linked to overall health, including heart disease and diabetes.  Please see the attached documents for additional preventive care recommendations.   Fall Prevention in the Home, Adult Falls can cause injuries and affect people of all ages. There are many simple things that you can do to make your home safe and to help prevent falls. If you need it, ask for help making these changes. What actions can I take to prevent falls? General information Use good lighting in all rooms. Make sure to: Replace any light bulbs that burn out. Turn on lights if it is dark and use night-lights. Keep items that you use often in easy-to-reach places. Lower the shelves around your home if needed. Move furniture so that there are clear paths around it. Do not keep throw rugs or other things on the floor that can make you trip. If any of your floors are uneven, fix them. Add color or contrast paint or  tape to clearly mark and help you see: Grab bars or handrails. First and last steps of staircases. Where the edge of each step is. If you use a ladder or stepladder: Make sure that it is fully opened. Do not climb a closed ladder. Make sure the sides of the ladder are locked in place. Have someone hold the ladder while you use it. Know where your pets are as you move  through your home. What can I do in the bathroom?     Keep the floor dry. Clean up any water that is on the floor right away. Remove soap buildup in the bathtub or shower. Buildup makes bathtubs and showers slippery. Use non-skid mats or decals on the floor of the bathtub or shower. Attach bath mats securely with double-sided, non-slip rug tape. If you need to sit down while you are in the shower, use a non-slip stool. Install grab bars by the toilet and in the bathtub and shower. Do not use towel bars as grab bars. What can I do in the bedroom? Make sure that you have a light by your bed that is easy to reach. Do not use any sheets or blankets on your bed that hang to the floor. Have a firm bench or chair with side arms that you can use for support when you get dressed. What can I do in the kitchen? Clean up any spills right away. If you need to reach something above you, use a sturdy step stool that has a grab bar. Keep electrical cables out of the way. Do not use floor polish or wax that makes floors slippery. What can I do with my stairs? Do not leave anything on the stairs. Make sure that you have a light switch at the top and the bottom of the stairs. Have them installed if you do not have them. Make sure that there are handrails on both sides of the stairs. Fix handrails that are broken or loose. Make sure that handrails are as long as the staircases. Install non-slip stair treads on all stairs in your home if they do not have carpet. Avoid having throw rugs at the top or bottom of stairs, or secure the rugs with carpet tape to prevent them from moving. Choose a carpet design that does not hide the edge of steps on the stairs. Make sure that carpet is firmly attached to the stairs. Fix any carpet that is loose or worn. What can I do on the outside of my home? Use bright outdoor lighting. Repair the edges of walkways and driveways and fix any cracks. Clear paths of anything that  can make you trip, such as tools or rocks. Add color or contrast paint or tape to clearly mark and help you see high doorway thresholds. Trim any bushes or trees on the main path into your home. Check that handrails are securely fastened and in good repair. Both sides of all steps should have handrails. Install guardrails along the edges of any raised decks or porches. Have leaves, snow, and ice cleared regularly. Use sand, salt, or ice melt on walkways during winter months if you live where there is ice and snow. In the garage, clean up any spills right away, including grease or oil spills. What other actions can I take? Review your medicines with your health care provider. Some medicines can make you confused or feel dizzy. This can increase your chance of falling. Wear closed-toe shoes that fit well and support your feet.  Wear shoes that have rubber soles and low heels. Use a cane, walker, scooter, or crutches that help you move around if needed. Talk with your provider about other ways that you can decrease your risk of falls. This may include seeing a physical therapist to learn to do exercises to improve movement and strength. Where to find more information Centers for Disease Control and Prevention, STEADI: tonerpromos.no [employer] on Aging: baseringtones.pl National Institute on Aging: baseringtones.pl Contact a health care provider if: You are afraid of falling at home. You feel weak, drowsy, or dizzy at home. You fall at home. Get help right away if you: Lose consciousness or have trouble moving after a fall. Have a fall that causes a head injury. These symptoms may be an emergency. Get help right away. Call 911. Do not wait to see if the symptoms will go away. Do not drive yourself to the hospital. This information is not intended to replace advice given to you by your health care provider. Make sure you discuss any questions you have with your health care provider. Document Revised:  01/25/2022 Document Reviewed: 01/25/2022 Elsevier Patient Education  2024 [employer].

## 2024-04-03 NOTE — Progress Notes (Signed)
 Subjective:   Robert Rasmussen is a 73 y.o. who presents for a Medicare Wellness preventive visit.  As a reminder, Annual Wellness Visits don't include a physical exam, and some assessments may be limited, especially if this visit is performed virtually. We may recommend an in-person follow-up visit with your provider if needed.  Visit Complete: In person    Persons Participating in Visit: Patient.  AWV Questionnaire: Yes: Patient Medicare AWV questionnaire was completed by the patient on 03/31/24; I have confirmed that all information answered by patient is correct and no changes since this date.  Cardiac Risk Factors include: advanced age (>3men, >46 women);male gender;hypertension;dyslipidemia;obesity (BMI >30kg/m2);Other (see comment), Risk factor comments: OSA (cpap)     Objective:    Today's Vitals   04/03/24 1308  BP: 134/68  Weight: (!) 341 lb 12.8 oz (155 kg)  Height: 5' 6.75 (1.695 m)   Body mass index is 53.94 kg/m.     04/03/2024    1:21 PM 03/29/2023    3:36 PM 09/08/2021   10:32 AM 08/25/2020    1:45 PM 09/10/2019    2:22 PM  Advanced Directives  Does Patient Have a Medical Advance Directive? Yes Yes Yes Yes Yes  Type of Estate Agent of [place];Living will Healthcare Power of [employer];Living will Healthcare Power of [employer] of [place];Living will Living will;Healthcare Power of Attorney  Does patient want to make changes to medical advance directive? No - Patient declined      Copy of Healthcare Power of Attorney in Chart? No - copy requested No - copy requested No - copy requested No - copy requested No - copy requested    Current Medications (verified) Outpatient Encounter Medications as of 04/03/2024  Medication Sig   atorvastatin  (LIPITOR) 10 MG tablet Take 1 tablet (10 mg total) by mouth daily. For further refills need to schedule visit   Cholecalciferol (VITAMIN D3) 25 MCG (1000 UT) CAPS Take 1 capsule by mouth  daily.   Multiple Vitamins-Minerals (ONE-A-DAY MENS 50+ ADVANTAGE) TABS Take 1 tablet by mouth daily.   triamcinolone cream (KENALOG) 0.1 % Apply 1 Application topically 2 (two) times daily.   No facility-administered encounter medications on file as of 04/03/2024.    Allergies (verified) Patient has no known allergies.   History: Past Medical History:  Diagnosis Date   Allergy    seasonal   Amebic dysentery    Asthma    Cataract 02/06/2023   Cellulitis    GERD (gastroesophageal reflux disease)    Hyperlipidemia    Sleep apnea 2016   using a cpap   Past Surgical History:  Procedure Laterality Date   TONSILLECTOMY     Family History  Problem Relation Age of Onset   Cancer Mother        breast   Breast cancer Mother    Hearing loss Mother    Varicose Veins Mother    Cancer Father        skin   Skin cancer Father    Heart disease Father    Cancer Sister        breast   Breast cancer Sister    Heart disease Paternal Grandmother        heart attack   Prostate cancer Neg Hx    Colon cancer Neg Hx    Social History   Socioeconomic History   Marital status: Divorced    Spouse name: Not on file   Number of children: 0  Years of education: Not on file   Highest education level: Master's degree (e.g., MA, MS, MEng, MEd, MSW, MBA)  Occupational History   Occupation: retired  Tobacco Use   Smoking status: Never   Smokeless tobacco: Never  Vaping Use   Vaping status: Never Used  Substance and Sexual Activity   Alcohol use: Not Currently    Comment: One drink every three months   Drug use: No   Sexual activity: Not Currently    Birth control/protection: Condom  Other Topics Concern   Not on file  Social History Narrative   Not on file   Social Drivers of Health   Financial Resource Strain: Low Risk  (04/03/2024)   Overall Financial Resource Strain (CARDIA)    Difficulty of Paying Living Expenses: Not hard at all  Food Insecurity: No Food Insecurity  (04/03/2024)   Hunger Vital Sign    Worried About Running Out of Food in the Last Year: Never true    Ran Out of Food in the Last Year: Never true  Transportation Needs: No Transportation Needs (04/03/2024)   PRAPARE - Administrator, Civil Service (Medical): No    Lack of Transportation (Non-Medical): No  Physical Activity: Sufficiently Active (04/03/2024)   Exercise Vital Sign    Days of Exercise per Week: 4 days    Minutes of Exercise per Session: 60 min  Stress: No Stress Concern Present (04/03/2024)   [employer] of Occupational Health - Occupational Stress Questionnaire    Feeling of Stress: Not at all  Social Connections: Moderately Integrated (04/03/2024)   Social Connection and Isolation Panel    Frequency of Communication with Friends and Family: More than three times a week    Frequency of Social Gatherings with Friends and Family: More than three times a week    Attends Religious Services: 1 to 4 times per year    Active Member of [employer] or Organizations: Yes    Attends Engineer, Structural: More than 4 times per year    Marital Status: Divorced    Tobacco Counseling Counseling given: Not Answered    Clinical Intake:  Pre-visit preparation completed: Yes  Pain : No/denies pain     BMI - recorded: 53.94 Nutritional Status: BMI > 30  Obese Nutritional Risks: None Diabetes: No  Lab Results  Component Value Date   HGBA1C 5.6 04/02/2024   HGBA1C 5.8 (H) 02/09/2023   HGBA1C 6.0 (H) 08/09/2022     How often do you need to have someone help you when you read instructions, pamphlets, or other written materials from your doctor or pharmacy?: 1 - Never  Interpreter Needed?: No  Information entered by :: Vina Ned, CMA   Activities of Daily Living     04/03/2024    1:13 PM 04/02/2024    1:46 PM  In your present state of health, do you have any difficulty performing the following activities:  Hearing? 0 0  Vision? 0 0   Difficulty concentrating or making decisions? 0 0  Walking or climbing stairs? 0 0  Dressing or bathing? 0 0  Doing errands, shopping? 0 0  Preparing Food and eating ? N   Using the Toilet? N   In the past six months, have you accidently leaked urine? N   Do you have problems with loss of bowel control? N   Managing your Medications? N   Managing your Finances? N   Housekeeping or managing your Housekeeping? N  Patient Care Team: Cannady, Jolene T, NP as PCP - General (Nurse Practitioner) Pa, [facility] Od Bear) Fernand Elfreda LABOR, MD as Consulting Physician (Pulmonary Disease)  I have updated your Care Teams any recent Medical Services you may have received from other providers in the past year.     Assessment:   This is a routine wellness examination for Robert Rasmussen.  Hearing/Vision screen Hearing Screening - Comments:: Denies hearing loss  Vision Screening - Comments:: UTD @ [facility] [place]   Goals Addressed               This Visit's Progress     Weight (lb) < 320 lb (145.2 kg) (pt-stated)   341 lb 12.8 oz (155 kg)      Depression Screen     04/03/2024    1:17 PM 04/02/2024    1:36 PM 03/29/2023    3:40 PM 02/09/2023    1:13 PM 08/09/2022    1:38 PM 01/12/2022   10:28 AM 09/08/2021   10:37 AM  PHQ 2/9 Scores  PHQ - 2 Score 0 0 0 0 0 0 0  PHQ- 9 Score 0 0 0 0 1 2     Fall Risk     04/03/2024    1:24 PM 04/02/2024    1:35 PM 03/31/2024    5:45 PM 03/29/2023    3:36 PM 03/25/2023    5:12 PM  Fall Risk   Falls in the past year? 0 0 1 0 0  Number falls in past yr: 0 0 0 0 0  Injury with Fall? 0 0 0 0   Risk for fall due to : No Fall Risks No Fall Risks  No Fall Risks   Follow up Falls evaluation completed Falls evaluation completed  Falls prevention discussed     MEDICARE RISK AT HOME:  Medicare Risk at Home Any stairs in or around the home?: Yes If so, are there any without handrails?: No Home free of loose throw rugs in  walkways, pet beds, electrical cords, etc?: Yes Adequate lighting in your home to reduce risk of falls?: Yes Life alert?: No Use of a cane, walker or w/c?: No Grab bars in the bathroom?: No Shower chair or bench in shower?: Yes Elevated toilet seat or a handicapped toilet?: No  TIMED UP AND GO:  Was the test performed?  Yes  Length of time to ambulate 10 feet: 5 sec Gait steady and fast without use of assistive device  Cognitive Function: 6CIT completed        04/03/2024    1:25 PM 03/29/2023    3:36 PM 09/08/2021   10:32 AM 08/25/2020    1:47 PM  6CIT Screen  What Year? 0 points 0 points  0 points  What month? 0 points 0 points  0 points  What time? 0 points 0 points 0 points 0 points  Count back from 20 0 points 0 points 0 points 0 points  Months in reverse 0 points 0 points 0 points 0 points  Repeat phrase 0 points 0 points 0 points 0 points  Total Score 0 points 0 points  0 points    Immunizations Immunization History  Administered Date(s) Administered   Fluad Quad(high Dose 65+) 02/26/2019, 02/28/2020, 05/14/2022   Fluad Trivalent(High Dose 65+) 02/09/2023   INFLUENZA, HIGH DOSE SEASONAL PF 03/13/2018, 01/19/2024   Influenza,inj,Quad PF,6+ Mos 03/10/2015, 07/14/2016, 07/19/2017   Influenza-Unspecified 05/22/2021, 05/14/2022, 01/19/2024   PFIZER Comirnaty(Gray Top)Covid-19 Tri-Sucrose Vaccine 11/28/2020  PFIZER(Purple Top)SARS-COV-2 Vaccination 07/16/2019, 08/09/2019, 03/25/2020, 11/28/2020, 05/14/2021   Pfizer Covid-19 Vaccine Bivalent Booster 56yrs & up 05/14/2021, 05/14/2022, 01/19/2024   Pfizer(Comirnaty)Fall Seasonal Vaccine 12 years and older 05/14/2022, 01/19/2024   Pneumococcal Conjugate-13 03/13/2018   Pneumococcal Polysaccharide-23 02/26/2019   Respiratory Syncytial Virus Vaccine,Recomb Aduvanted(Arexvy) 05/25/2022   Td 08/23/2007   Tdap 07/14/2016   Zoster Recombinant(Shingrix ) 02/12/2020, 07/15/2021   Zoster, Live 03/23/2013    Screening  Tests Health Maintenance  Topic Date Due   Fecal DNA (Cologuard)  07/22/2023   COVID-19 Vaccine (10 - 2025-26 season) 04/17/2024 (Originally 03/15/2024)   Medicare Annual Wellness (AWV)  04/03/2025   DTaP/Tdap/Td (3 - Td or Tdap) 07/14/2026   Pneumococcal Vaccine: 50+ Years  Completed   Influenza Vaccine  Completed   Hepatitis C Screening  Completed   Zoster Vaccines- Shingrix   Completed   Meningococcal B Vaccine  Aged Out    Health Maintenance Items Addressed: See Nurse Notes at the end of this note  Additional Screening:  Vision Screening: Recommended annual ophthalmology exams for early detection of glaucoma and other disorders of the eye. Is the patient up to date with their annual eye exam?  Yes  Who is the provider or what is the name of the office in which the patient attends annual eye exams? [facility] [place]  Dental Screening: Recommended annual dental exams for proper oral hygiene  Community Resource Referral / Chronic Care Management: CRR required this visit?  No   CCM required this visit?  No   Plan:    I have personally reviewed and noted the following in the patient's chart:   Medical and social history Use of alcohol, tobacco or illicit drugs  Current medications and supplements including opioid prescriptions. Patient is not currently taking opioid prescriptions. Functional ability and status Nutritional status Physical activity Advanced directives List of other physicians Hospitalizations, surgeries, and ER visits in previous 12 months Vitals Screenings to include cognitive, depression, and falls Referrals and appointments  In addition, I have reviewed and discussed with patient certain preventive protocols, quality metrics, and best practice recommendations. A written personalized care plan for preventive services as well as general preventive health recommendations were provided to patient.   Vina Ned, CMA   04/03/2024    After Visit Summary: (In Person-Printed) AVS printed and given to the patient  Notes:  Due for cologuard. Order placed on 04/02/24

## 2024-04-03 NOTE — Progress Notes (Signed)
 Contacted via MyChart  Good evening Robert Rasmussen, your labs have returned and overall remain stable.  No medication changes needed.  Great job!!  Any questions? Keep being amazing!!  Thank you for allowing me to participate in your care.  I appreciate you. Kindest regards, Reisa Coppola

## 2024-04-18 DIAGNOSIS — H52222 Regular astigmatism, left eye: Secondary | ICD-10-CM | POA: Diagnosis not present

## 2024-04-18 DIAGNOSIS — H2513 Age-related nuclear cataract, bilateral: Secondary | ICD-10-CM | POA: Diagnosis not present

## 2024-04-18 DIAGNOSIS — Z01818 Encounter for other preprocedural examination: Secondary | ICD-10-CM | POA: Diagnosis not present

## 2024-04-18 DIAGNOSIS — H2512 Age-related nuclear cataract, left eye: Secondary | ICD-10-CM | POA: Diagnosis not present

## 2024-04-19 DIAGNOSIS — Z1211 Encounter for screening for malignant neoplasm of colon: Secondary | ICD-10-CM | POA: Diagnosis not present

## 2024-04-24 LAB — COLOGUARD: COLOGUARD: NEGATIVE

## 2024-04-25 DIAGNOSIS — H2512 Age-related nuclear cataract, left eye: Secondary | ICD-10-CM | POA: Diagnosis not present

## 2024-04-25 DIAGNOSIS — H25812 Combined forms of age-related cataract, left eye: Secondary | ICD-10-CM | POA: Diagnosis not present

## 2024-04-25 DIAGNOSIS — H52222 Regular astigmatism, left eye: Secondary | ICD-10-CM | POA: Diagnosis not present

## 2024-04-25 DIAGNOSIS — I10 Essential (primary) hypertension: Secondary | ICD-10-CM | POA: Diagnosis not present

## 2024-04-25 NOTE — Progress Notes (Signed)
 Contacted via MyChart  Cologuard negative, repeat in 3 years. Great news!!

## 2024-05-09 DIAGNOSIS — H25811 Combined forms of age-related cataract, right eye: Secondary | ICD-10-CM | POA: Diagnosis not present

## 2024-05-09 DIAGNOSIS — H2511 Age-related nuclear cataract, right eye: Secondary | ICD-10-CM | POA: Diagnosis not present

## 2024-05-09 DIAGNOSIS — I10 Essential (primary) hypertension: Secondary | ICD-10-CM | POA: Diagnosis not present

## 2024-10-02 ENCOUNTER — Ambulatory Visit: Admitting: Nurse Practitioner

## 2025-04-09 ENCOUNTER — Ambulatory Visit
